# Patient Record
Sex: Male | Born: 1965 | Race: Asian | Hispanic: No | Marital: Married | Smoking: Never smoker
Health system: Southern US, Community
[De-identification: ages and names within clinical notes are randomized; demographics above are authoritative.]

---

## 1998-10-19 ENCOUNTER — Emergency Department (HOSPITAL_COMMUNITY): Admission: EM | Admit: 1998-10-19 | Discharge: 1998-10-19 | Payer: Self-pay | Admitting: Emergency Medicine

## 1998-12-22 ENCOUNTER — Encounter: Admission: RE | Admit: 1998-12-22 | Discharge: 1998-12-22 | Payer: Self-pay | Admitting: Family Medicine

## 1999-02-02 ENCOUNTER — Encounter: Admission: RE | Admit: 1999-02-02 | Discharge: 1999-02-02 | Payer: Self-pay | Admitting: Family Medicine

## 2014-02-03 ENCOUNTER — Emergency Department (HOSPITAL_COMMUNITY)
Admission: EM | Admit: 2014-02-03 | Discharge: 2014-02-03 | Disposition: A | Discharge status: Nursing Home (Includes ICF) | Payer: PRIVATE HEALTH INSURANCE | Source: Home / Self Care | Attending: Family Medicine | Admitting: Family Medicine

## 2014-02-03 ENCOUNTER — Emergency Department (HOSPITAL_COMMUNITY): Payer: PRIVATE HEALTH INSURANCE

## 2014-02-03 ENCOUNTER — Emergency Department (HOSPITAL_COMMUNITY)
Admission: EM | Admit: 2014-02-03 | Discharge: 2014-02-04 | Disposition: A | Discharge status: Home / Self Care | Payer: PRIVATE HEALTH INSURANCE | Attending: Emergency Medicine | Admitting: Emergency Medicine

## 2014-02-03 ENCOUNTER — Encounter (HOSPITAL_COMMUNITY): Payer: Self-pay | Admitting: Emergency Medicine

## 2014-02-03 DIAGNOSIS — R0602 Shortness of breath: Secondary | ICD-10-CM | POA: Insufficient documentation

## 2014-02-03 DIAGNOSIS — R079 Chest pain, unspecified: Secondary | ICD-10-CM

## 2014-02-03 DIAGNOSIS — R1013 Epigastric pain: Secondary | ICD-10-CM | POA: Insufficient documentation

## 2014-02-03 DIAGNOSIS — R110 Nausea: Secondary | ICD-10-CM | POA: Insufficient documentation

## 2014-02-03 DIAGNOSIS — R0789 Other chest pain: Secondary | ICD-10-CM | POA: Insufficient documentation

## 2014-02-03 LAB — I-STAT TROPONIN, ED
Troponin i, poc: 0 ng/mL (ref 0.00–0.08)
Troponin i, poc: 0 ng/mL (ref 0.00–0.08)

## 2014-02-03 LAB — BASIC METABOLIC PANEL
BUN: 16 mg/dL (ref 6–23)
CHLORIDE: 99 meq/L (ref 96–112)
CO2: 25 meq/L (ref 19–32)
CREATININE: 0.64 mg/dL (ref 0.50–1.35)
Calcium: 8.6 mg/dL (ref 8.4–10.5)
GFR calc Af Amer: >90 mL/min (ref >90)
GFR calc non Af Amer: >90 mL/min (ref >90)
Glucose, Bld: 189 mg/dL — ABNORMAL HIGH (ref 70–99)
Potassium: 3.7 mEq/L (ref 3.7–5.3)
Sodium: 138 mEq/L (ref 137–147)

## 2014-02-03 LAB — CBC
HEMATOCRIT: 43.8 % (ref 39.0–52.0)
Hemoglobin: 15.3 g/dL (ref 13.0–17.0)
MCH: 32.7 pg (ref 26.0–34.0)
MCHC: 34.9 g/dL (ref 30.0–36.0)
MCV: 93.6 fL (ref 78.0–100.0)
Platelets: 208 10*3/uL (ref 150–400)
RBC: 4.68 MIL/uL (ref 4.22–5.81)
RDW: 12.1 % (ref 11.5–15.5)
WBC: 8 10*3/uL (ref 4.0–10.5)

## 2014-02-03 LAB — PRO B NATRIURETIC PEPTIDE

## 2014-02-03 LAB — D-DIMER, QUANTITATIVE: D-Dimer, Quant: <0.27 ug/mL-FEU (ref 0.00–0.48)

## 2014-02-03 MED ORDER — GI COCKTAIL ~~LOC~~
30.0000 mL | Freq: Once | ORAL | Status: AC
  Administered 2014-02-03: 30 mL via ORAL
  Filled 2014-02-03: qty 30

## 2014-02-03 MED ORDER — SODIUM CHLORIDE 0.9 % IV SOLN
Freq: Once | INTRAVENOUS | Status: AC
  Administered 2014-02-03: 19:00:00 via INTRAVENOUS

## 2014-02-03 MED ORDER — OMEPRAZOLE 20 MG PO CPDR: 20.0000 mg | DELAYED_RELEASE_CAPSULE | Freq: Every day | ORAL | Status: DC

## 2014-02-03 MED ORDER — NITROGLYCERIN 0.4 MG SL SUBL
SUBLINGUAL_TABLET | SUBLINGUAL | Status: AC
  Filled 2014-02-03: qty 1

## 2014-02-03 MED ORDER — ASPIRIN 81 MG PO CHEW
324.0000 mg | CHEWABLE_TABLET | Freq: Once | ORAL | Status: AC
  Administered 2014-02-03: 324 mg via ORAL

## 2014-02-03 MED ORDER — FAMOTIDINE 20 MG PO TABS: 20.0000 mg | ORAL_TABLET | Freq: Two times a day (BID) | ORAL | Status: DC

## 2014-02-03 MED ORDER — ASPIRIN 81 MG PO CHEW
CHEWABLE_TABLET | ORAL | Status: AC
  Filled 2014-02-03: qty 4

## 2014-02-03 NOTE — ED Provider Notes (Signed)
Medical screening examination/treatment/procedure(s) were performed by a resident physician or non-physician practitioner and as the supervising physician I was immediately available for consultation/collaboration.  Preston Weill, MD    Green Quincy S Sajjad Honea, MD 02/03/14 2137 

## 2014-02-03 NOTE — ED Notes (Signed)
Pt placed on 2 lpm by [facility]; 100% spO2, and cardiac monitor; 80 bpm.

## 2014-02-03 NOTE — Discharge Instructions (Signed)
Your chest x-ray, lab work, ecg all normal. i think your pain may be due to acid reflux. See information below. Take pepcid and prilosec daily. Follow up with primary care doctor.   Gastroesophageal Reflux Disease, Adult Gastroesophageal reflux disease (GERD) happens when acid from your stomach flows up into the esophagus. When acid comes in contact with the esophagus, the acid causes soreness (inflammation) in the esophagus. Over time, GERD may create small holes (ulcers) in the lining of the esophagus. CAUSES   Increased body weight. This puts pressure on the stomach, making acid rise from the stomach into the esophagus.  Smoking. This increases acid production in the stomach.  Drinking alcohol. This causes decreased pressure in the lower esophageal sphincter (valve or ring of muscle between the esophagus and stomach), allowing acid from the stomach into the esophagus.  Late evening meals and a full stomach. This increases pressure and acid production in the stomach.  A malformed lower esophageal sphincter. Sometimes, no cause is found. SYMPTOMS   Burning pain in the lower part of the mid-chest behind the breastbone and in the mid-stomach area. This may occur twice a week or more often.  Trouble swallowing.  Sore throat.  Dry cough.  Asthma-like symptoms including chest tightness, shortness of breath, or wheezing. DIAGNOSIS  Your caregiver may be able to diagnose GERD based on your symptoms. In some cases, X-rays and other tests may be done to check for complications or to check the condition of your stomach and esophagus. TREATMENT  Your caregiver may recommend over-the-counter or prescription medicines to help decrease acid production. Ask your caregiver before starting or adding any new medicines.  HOME CARE INSTRUCTIONS   Change the factors that you can control. Ask your caregiver for guidance concerning weight loss, quitting smoking, and alcohol consumption.  Avoid foods and  drinks that make your symptoms worse, such as:  Caffeine or alcoholic drinks.  Chocolate.  Peppermint or mint flavorings.  Garlic and onions.  Spicy foods.  Citrus fruits, such as oranges, lemons, or limes.  Tomato-based foods such as sauce, chili, salsa, and pizza.  Fried and fatty foods.  Avoid lying down for the 3 hours prior to your bedtime or prior to taking a nap.  Eat small, frequent meals instead of large meals.  Wear loose-fitting clothing. Do not wear anything tight around your waist that causes pressure on your stomach.  Raise the head of your bed 6 to 8 inches with wood blocks to help you sleep. Extra pillows will not help.  Only take over-the-counter or prescription medicines for pain, discomfort, or fever as directed by your caregiver.  Do not take aspirin, ibuprofen, or other nonsteroidal anti-inflammatory drugs (NSAIDs). SEEK IMMEDIATE MEDICAL CARE IF:   You have pain in your arms, neck, jaw, teeth, or back.  Your pain increases or changes in intensity or duration.  You develop nausea, vomiting, or sweating (diaphoresis).  You develop shortness of breath, or you faint.  Your vomit is green, yellow, black, or looks like coffee grounds or blood.  Your stool is red, bloody, or black. These symptoms could be signs of other problems, such as heart disease, gastric bleeding, or esophageal bleeding. MAKE SURE YOU:   Understand these instructions.  Will watch your condition.  Will get help right away if you are not doing well or get worse. Document Released: 07/04/2005 Document Revised: 12/17/2011 Document Reviewed: 04/13/2011 [facility] Patient Information 2014 [place], [place].   Emergency Department Resource Guide 1) Find a Doctor and  Pay Out of Pocket Although you won't have to find out who is covered by your insurance plan, it is a good idea to ask around and get recommendations. You will then need to call the office and see if the doctor you have  chosen will accept you as a new patient and what types of options they offer for patients who are self-pay. Some doctors offer discounts or will set up payment plans for their patients who do not have insurance, but you will need to ask so you aren't surprised when you get to your appointment.  2) Contact Your Local Health Department Not all health departments have doctors that can see patients for sick visits, but many do, so it is worth a call to see if yours does. If you don't know where your local health department is, you can check in your phone book. The CDC also has a tool to help you locate your state's health department, and many state websites also have listings of all of their local health departments.  3) Find a Walk-in Clinic If your illness is not likely to be very severe or complicated, you may want to try a walk in clinic. These are popping up all over the country in pharmacies, drugstores, and shopping centers. They're usually staffed by nurse practitioners or physician assistants that have been trained to treat common illnesses and complaints. They're usually fairly quick and inexpensive. However, if you have serious medical issues or chronic medical problems, these are probably not your best option.  No Primary Care Doctor: - Call Health Connect at  [phone] - they can help you locate a primary care doctor that  accepts your insurance, provides certain services, etc. - Physician Referral Service- [phone]  Chronic Pain Problems: Organization         Address  Phone   Notes  [facility]  [phone] Patients need to be referred by their primary care doctor.   Medication Assistance: Organization         Address  Phone   Notes  [facility] Medication [facility] [address] [place], [place] [address] [phone] --Must be a resident of [facility] -- Must have NO insurance coverage whatsoever (no Medicaid/ Medicare,  etc.) -- The pt. MUST have a primary care doctor that directs their care regularly and follows them in the community   [facility]  [phone]   [employer]  [phone]    Agencies that provide inexpensive medical care: Organization         Address  Phone   Notes  [facility]  [phone]   [facility]    [phone]   [facility] [address] [place], [place] [address] [phone]   [facility] [address] [place] [place] [phone]   Planned Parenthood    [phone]   [facility]    [phone]   [facility]  [address], [place] Phone:  [phone], Fax:  [phone] Hours of Operation:  9 am - 6 pm, M-F.  Also accepts Medicaid/Medicare and self-pay.  [facility]  [address], [place] Phone: [phone], Fax: [phone]. Hours of Operation:  8:30 am - 5:30 pm, M-F.  Also accepts Medicaid and self-pay.  [facility] [place] [address], [employer] Phone: [phone]  [facility] [address], [place], [place] [phone], Ext. [phone] Mondays & Thursdays: 7-9 AM.  First 15 patients are seen on a first come, first serve basis.    Medicaid-accepting [facility] Providers:  Organization         Address  Phone   Notes  [facility] [address], [place] [phone] Also accepts self-pay patients.  [facility] [address], [place]  [phone]) [phone]   [facility] [address], [place] [phone]) [phone]   [facility] [address] [place] [address], [place] [phone]) [phone]   Renaye RakersVeita Bland [address], [place]   [phone]) [phone] Only accepts [place] Access [place]icaid patients after they have their name applied to their card.   Self-Pay (no  insurance) in [facility]:  Organization         Address  Phone   Notes  Sickle Cell Patients, [facility] [address], [place] [phone]   [facility] [address], [place] [phone]) [phone]   [facility]  [address], [place] [phone]   [facility]/Dr. Osei-Bonsu  [address] [place] [address], [place] or [phone], [place] [phone]) [phone] Phone number for both [place] and [place] locations is the same.  [facility] [address], [place] [phone]) [phone]   [facility] [address] [place] [address], [place] or [address] [phone]) [phone]   [facility] [address], [place] [phone]) [phone], phone; [phone](336) [phone], fax Sees patients 1st and 3rd Saturday of every month.  Must not qualify for public or private insurance (i.e. Medicaid, Medicare, [place] Health Choice, Veterans' Benefits)  Household income should be no more than 200% of the poverty level The clinic cannot treat you if you are pregnant or think you are pregnant  Sexually transmitted diseases are not treated at the clinic.    Dental Care: Organization         Address  Phone  Notes  [facility] [address], [place] [phone]) [phone] Accepts children up to age 46 who are enrolled in [place]icaid or [place] Health Choice; pregnant women with a Medicaid card; and children who have applied for Medicaid or [place] Health Choice, but were declined, whose parents can pay a reduced fee at time of service.  [facility] of [facility] [place]  [address], [place] [phone]) [phone] Accepts children up to age 46 who are enrolled in [place]icaid or [place] Health Choice; pregnant women with a Medicaid card; and children who have applied for Medicaid or [place] Health Choice, but were  declined, whose parents can pay a reduced fee at time of service.  [facility]  [address], [place] [phone]) [phone] Patients are seen by appointment only. Walk-ins are not accepted. [facility] will see patients 46 years of age and older. Monday - Tuesday (8am-5pm) Most Wednesdays (8:30-5pm) $30 per visit, cash only  [facility]  [address], [facility] [phone]) [phone] Patients are seen by appointment only. Walk-ins are not accepted. [facility] will see patients 46 years of age and older. One Wednesday Evening (Monthly: Volunteer Based).  $30 per visit,  cash only  [employer] [facility]  [phone] for adults; Children under age 604, call Graduate Pediatric Dentistry at [phone]. Children aged 874-14, please call [phone] to request a pediatric application.  Dental services are provided in all areas of dental care including fillings, crowns and bridges, complete and partial dentures, implants, gum treatment, root canals, and extractions. Preventive care is also provided. Treatment is provided to both adults and children. Patients are selected via a lottery and there is often a waiting list.   [facility] [address], [place]  [phone]) [phone] [email or web address]   [facility] [address], [place], [place] [phone], Ext. [phone] Second and Fourth Thursday of each month, opens at 6:30 AM; Clinic ends at 9 AM.  Patients are seen on a first-come first-served basis, and a limited number are seen during each clinic.   [facility]  [address], [place], [place] [phone]) [phone]   Eligibility Requirements You must have lived in [place], [place], or [place] counties for at least the last three months.   You cannot be eligible for state or federal sponsored National Cityhealthcare insurance, including CIGNAVeterans Administration, [place]icaid, or [employer]icare.   You generally cannot be  eligible for healthcare insurance through your employer.    How to apply: Eligibility screenings are held every Tuesday and Wednesday afternoon from 1:00 pm until 4:00 pm. You do not need an appointment for the interview!  [facility] [address], [place], [place] [phone]   [facility]  [phone]   [facility]  [phone]   [facility]  [phone]    Behavioral Health Resources in the Community: Intensive Outpatient Programs Organization         Address  Phone  Notes  [facility] [address], [place], [place] [phone]   [facility] [address], [place], [place] [phone]   [facility] [address], [place], [place]  [phone]   [facility] [address],  [place], [place] [phone] or [phone]   Substance Abuse Resources Organization         Address  Phone  Notes  [facility]  [phone]   [facility]  [phone]   [facility]  [phone]   [facility]  [phone]   Residential & Outpatient Substance Abuse Program  [phone]   Psychological Services Organization         Address  Phone  Notes  [facility]  [phone]- [phone]   [facility]  [phone]- [phone]   [facility] [address], [place] [phone] or [phone]    Mobile Crisis Teams Organization         Address  Phone  Notes  [facility], Mobile Crisis Care Unit  [phone]   [facility]  [address] [place], [place] [phone]   Doristine LocksSharon DeEsch [address] [place] [phone]    Self-Help/Support Groups Organization         Address  Phone             Notes  [facility] [place] - variety of support groups  336- [phone] Call for more information    Narcotics Anonymous (NA), [facility] [address], [place]  2 meetings at this location   [facility]istician[facility]idential Treatment Programs Organization  Address  Phone  Notes  °[facility] [address],    °[place]  [phone]   °[facility] ° [address], [place], [place] [phone]   °[facility] [address], [place] [phone] Admissions: 8am-3pm M-F  °[facility] [address]    °[place], [place] [phone]   °[facility] [address], [place], [place] [phone]   °[facility] [address]  °[place], [place] [phone]   °[facility] - Intensive Outpatient [address], [place], [place] [phone]   °[facility] ([facility] [address]  °[place], [place] [phone] or [phone]   °[facility] ([facility]) [address] [place], [place] [phone] Accepts Medicaid  °[facility] [address]  °[place] 1-800-659-3381 Substance Abuse/Addiction Treatment  ° °[place] Behavioral Health Resources °Organization         Address  Phone  Notes  °[facility]  (888) [phone]   °Julie Brannon, PhD [address] [place], [place]   (336) [phone] or (336) 951-0000   °[facility]   [address] °[place], [place] (336) [phone]   °[facility] [address], [place], [place] (336) [phone] Insurance/Medicaid/sponsorship through [facility]  °[facility] [address]                                    [place], [place] ([phone]) [phone] Therapy/tele-psych/case  °[facility] [address]  ° [place], [place] ([phone]) [phone]    °Dr. Arfeen  ([phone]) [phone]   °[facility] [place]  [employer] [place] [facility] 1) [address], [place] °2) [address], [place] °3)  [address], [place] (336) [phone] °(336)  [phone] ° °(336) [phone]   °[place] Child Abuse Hotline (336) 342-1394 or (336) 342-3537 (After Hours)    ° ° ° °

## 2014-02-03 NOTE — ED Notes (Addendum)
C/o SOB and chest pain onset 2-3 days ago.  Points to epigastric area( pointed to L ant chest when EMT assessed pt.).  Pain comes and goes and lasts 30 min.  Had 1 episode today.  C/o nausea.  Radiation of pain to his neck.  No sweating.  C/o H/A onset today. Pain onset while lying in bed trying to sleep.

## 2014-02-03 NOTE — ED Notes (Signed)
PER EMS: pt transferred from Urgent Care, was seen there for epigastric, mid lower chest pain that started 3 days ago associated with SOB and nausea. [place] from [place], his father had just recently passed away. Pt reports he is pain free at this time.  BP-133/85, HR-89, RR-16, 100% 2LNC.

## 2014-02-03 NOTE — ED Provider Notes (Signed)
CSN: [id number]     Arrival date & time 02/03/14  1905 History   First MD Initiated Contact with Patient 02/03/14 1906     Chief Complaint  Patient presents with  . Chest Pain     (Consider location/radiation/quality/duration/timing/severity/associated sxs/prior Treatment) HPI Patrick Gutierrez is a 48 y.o. male who presents emergency department complaining of chest pain. Patient states that he has had lower chest/epigastric pain for last 3 days that comes and goes. States pain is mainly when he comes home from work and when he lays down. He denies any associated symptoms. He states that pain is a burning, radiates to his throat at times. He reports history of heartburn. He states he does sometimes develop pain after eating in the same area. He denies any heart problems. No family history of heart problems except for a heart failure in his father. Patient is not a smoker. He is diabetic, well-controlled. Patient went to urgent care because he is having pain, transferred here for further evaluation.  History reviewed. No pertinent past medical history. History reviewed. No pertinent past surgical history. Family History  Problem Relation Age of Onset  . Heart failure Father    History  Substance Use Topics  . Smoking status: Never Smoker   . Smokeless tobacco: Not on file  . Alcohol Use: Yes     Comment: occasional    Review of Systems  Constitutional: Negative for fever and chills.  Respiratory: Positive for shortness of breath. Negative for cough and chest tightness.   Cardiovascular: Positive for chest pain. Negative for palpitations and leg swelling.  Gastrointestinal: Positive for nausea and abdominal pain. Negative for vomiting, diarrhea and abdominal distention.  Genitourinary: Negative for dysuria, urgency, frequency and hematuria.  Musculoskeletal: Negative for arthralgias, myalgias, neck pain and neck stiffness.  Skin: Negative for rash.  Allergic/Immunologic: Negative for  immunocompromised state.  Neurological: Negative for dizziness, weakness, light-headedness, numbness and headaches.      Allergies  Review of patient's allergies indicates no known allergies.  Home Medications   Prior to Admission medications   Not on File   BP 111/74  Pulse 65  Temp(Src) 98.4 F (36.9 C) (Oral)  Resp 15  SpO2 99% Physical Exam  Nursing note and vitals reviewed. Constitutional: He appears well-developed and well-nourished. No distress.  HENT:  Head: Normocephalic and atraumatic.  Eyes: Conjunctivae are normal.  Neck: Neck supple.  Cardiovascular: Normal rate, regular rhythm and normal heart sounds.   Pulmonary/Chest: Effort normal. No respiratory distress. He has no wheezes. He has no rales.  Abdominal: Soft. Bowel sounds are normal. He exhibits no distension. There is tenderness. There is no rebound.  Epigastric tenderness  Musculoskeletal: He exhibits no edema.  Neurological: He is alert.  Skin: Skin is warm and dry.    ED Course  Procedures (including critical care time) Labs Review Labs Reviewed  BASIC METABOLIC PANEL - Abnormal; Notable for the following:    Glucose, Bld 189 (*)    All other components within normal limits  CBC  D-DIMER, QUANTITATIVE  PRO B NATRIURETIC PEPTIDE  I-STAT TROPOININ, ED    Imaging Review Dg Chest 2 View  02/03/2014   CLINICAL DATA:  Chest pain.  EXAM: CHEST  2 VIEW  COMPARISON:  None.  FINDINGS: The lungs are mildly hyperinflated. There is no focal infiltrate. There is minimal prominence of the perihilar interstitial markings. The cardiopericardial silhouette is normal in size. The mediastinum is normal in width. The pulmonary vascularity is not engorged. There  is no pleural effusion. The trachea is midline. The observed portions of the bony thorax exhibit no acute abnormalities.  IMPRESSION: There is mild hyperinflation which may reflect COPD or reactive airway disease. One cannot exclude acute bronchitis in the  appropriate clinical setting. There is no evidence of pneumonia nor CHF.   Electronically Signed   By: David  Swazil[place]Jordan   On: 02/03/2014 20:21     EKG Interpretation   Date/Time:  Wednesday February 03 2014 19:11:44 EDT Ventricular Rate:  79 PR Interval:  143 QRS Duration: 79 QT Interval:  362 QTC Calculation: 415 R Axis:   49 Text Interpretation:  Sinus rhythm No significant change since last  tracing Confirmed by [facility]  MD, MARTHA [phone]) on 02/03/2014 9:51:00 PM      MDM   Final diagnoses:  Chest pain  Epigastric pain    Pt here with epigastric abdominal/lower chest pain. Reports burning in his chest and his throat. States pain is worsened with eating as well. History of heartburn. Pain sounds very atypical, and tenderness reproducible with palpation of epigastric area. Labs, chest x-ray, troponin ordered. I given him GI cocktail which took his pain completely away.   Patient monitored for 5 hours, delta troponin was repeated both negative. Patient remains pain-free. He is heart score 1, TIMI 0. Highly unlikely that his pain is due to CAD. D dimer obtained to r/o pe. His cxr is negative except for bronchitic changes. Pt denise any smoking hx, ho hx of asthma, no current cough. Discussed results with pt and his family. Will start on pepcid, prilosec, suspect most likely GERD. Follow up with pcp.    Filed Vitals:   02/03/14 2100 02/03/14 2215 02/03/14 2300 02/04/14 0003  BP: 124/82 119/71 111/74 116/79  Pulse: 98 92 65 71  Temp:    98 F (36.7 C)  TempSrc:    Oral  Resp: 24 19 15 18   SpO2: 100% 99% 99% 100%       Lottie Musselatyana A Detrich Rakestraw, PA-C 02/04/14 0220

## 2014-02-03 NOTE — ED Notes (Signed)
[facility] here. Report given to RN. 

## 2014-02-03 NOTE — ED Provider Notes (Signed)
CSN: [id number]     Arrival date & time 02/03/14  1759 History   First MD Initiated Contact with Patient 02/03/14 1821     Chief Complaint  Patient presents with  . Shortness of Breath   (Consider location/radiation/quality/duration/timing/severity/associated sxs/prior Treatment) HPI  48 year old M with chest pain. Intermittent for several days. This episode started about 30 minutes ago. It is located in his central chest and does not radiate. It is associated with shortness of breath and nausea but no vomiting. It is not associated with exertion. He denies any recent trauma. He does have diabetes, smoking tobacco, hypertension, or hyperlipidemia. He denies any family history of cardiac disease. The past medical history of cardiac or pulmonary disease.  The patient speaks the knees and no professional interpreter was available. Given the urgency of the evaluation, his daughter was used for interpretation.  History reviewed. No pertinent past medical history. History reviewed. No pertinent past surgical history. Family History  Problem Relation Age of Onset  . Heart failure Father    History  Substance Use Topics  . Smoking status: Never Smoker   . Smokeless tobacco: Not on file  . Alcohol Use: Yes     Comment: occasional    Review of Systems See HPI Allergies  Review of patient's allergies indicates no known allergies.  Home Medications   Prior to Admission medications   Not on File   BP 135/94  Pulse 98  Temp(Src) 97.9 F (36.6 C) (Oral)  Resp 18  SpO2 100% Physical Exam Gen: middle-aged male is male, pleasant, uncomfortable appearing, Normal body habitus  CV: regular rhythm, no murmurs Pulmonary: clear to auscultation bilaterally, normal work of breathing  Abdomen: soft, nondistended, nontender  ED Course  Procedures (including critical care time) Labs Review Labs Reviewed - No data to display  Imaging Review No results found.   Date: 02/03/2014  Rate:  82  Rhythm: normal sinus rhythm  QRS Axis: normal  Intervals: normal  ST/T Wave abnormalities: normal  Conduction Disutrbances:none  Narrative Interpretation: no STEMI  Old EKG Reviewed: none available     MDM   1. Chest pain    Given ASA 324 mg and nitroglycerin. No evidence of STEMI, but concerning for cardiac origin, so [facility]     Garnetta BuddyEdward V Ulric Salzman, MD 02/03/14 [phone]

## 2014-02-04 NOTE — ED Provider Notes (Signed)
Medical screening examination/treatment/procedure(s) were performed by non-physician practitioner and as supervising physician I was immediately available for consultation/collaboration.    Raedyn Klinck D Alexias Margerum, MD 02/04/14 0705 

## 2014-03-26 ENCOUNTER — Ambulatory Visit (INDEPENDENT_AMBULATORY_CARE_PROVIDER_SITE_OTHER): Payer: No Typology Code available for payment source | Admitting: Internal Medicine

## 2014-03-26 VITALS — BP 106/70 | HR 69 | Temp 98.1°F | Resp 16 | Ht 63.25 in | Wt 105.4 lb

## 2014-03-26 DIAGNOSIS — R7309 Other abnormal glucose: Secondary | ICD-10-CM

## 2014-03-26 DIAGNOSIS — R739 Hyperglycemia, unspecified: Secondary | ICD-10-CM

## 2014-03-26 DIAGNOSIS — Z0000 Encounter for general adult medical examination without abnormal findings: Secondary | ICD-10-CM

## 2014-03-26 LAB — POCT CBC
GRANULOCYTE PERCENT: 61.8 % (ref 37–80)
HCT, POC: 44.4 % (ref 43.5–53.7)
Hemoglobin: 14.6 g/dL (ref 14.1–18.1)
Lymph, poc: 2 (ref 0.6–3.4)
MCH, POC: 32.3 pg — AB (ref 27–31.2)
MCHC: 32.9 g/dL (ref 31.8–35.4)
MCV: 98.2 fL — AB (ref 80–97)
MID (cbc): 0.5 (ref 0–0.9)
MPV: 9.7 fL (ref 0–99.8)
PLATELET COUNT, POC: 209 10*3/uL (ref 142–424)
POC GRANULOCYTE: 4 (ref 2–6.9)
POC LYMPH %: 30.6 % (ref 10–50)
POC MID %: 7.6 %M (ref 0–12)
RBC: 4.52 M/uL — AB (ref 4.69–6.13)
RDW, POC: 12.6 %
WBC: 6.5 10*3/uL (ref 4.6–10.2)

## 2014-03-26 LAB — POCT GLYCOSYLATED HEMOGLOBIN (HGB A1C): Hemoglobin A1C: 5

## 2014-03-26 LAB — GLUCOSE, POCT (MANUAL RESULT ENTRY): POC GLUCOSE: 94 mg/dL (ref 70–99)

## 2014-03-26 NOTE — Progress Notes (Signed)
Subjective:    Patient ID: Patrick Gutierrez, male    DOB: 06/10/1966, 48 y.o.   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  HENT: Negative for congestion, dental problem, hearing loss, rhinorrhea, trouble swallowing and voice change.   Eyes: Negative for photophobia and visual disturbance.  Respiratory: Negative for apnea, cough, shortness of breath and wheezing.   Cardiovascular: Negative for chest pain, palpitations and leg swelling.  Gastrointestinal: Negative for nausea, vomiting, abdominal pain, diarrhea and constipation.  Genitourinary: Negative for difficulty urinating and testicular pain.       No Nocturia   Musculoskeletal: Negative for arthralgias, back pain, gait problem, joint swelling, myalgias and neck pain.  Skin: Negative for rash.  Allergic/Immunologic: Negative for immunocompromised state.  Neurological: Negative for dizziness, speech difficulty, light-headedness and headaches.  Hematological: Negative for adenopathy. Does not bruise/bleed easily.  Psychiatric/Behavioral: Negative for behavioral problems, sleep disturbance and dysphoric mood.       Objective:   Physical Exam  Constitutional: He is oriented to person, place, and time. He appears well-developed and well-nourished.  Thin but appears healthy  HENT:  Head: Normocephalic.  Right Ear: External ear normal.  Left Ear: External ear normal.  Nose: Nose normal.  Mouth/Throat: Oropharynx is clear and moist.  Tms and canals clear    Eyes: Conjunctivae and EOM are normal. Pupils are equal, round, and reactive to light.  Neck: Normal range of motion. Neck supple. No thyromegaly present.  Cardiovascular: Normal rate, regular rhythm, normal heart sounds and intact distal pulses.   No murmur heard. Pulmonary/Chest: Effort normal and breath sounds normal. No respiratory distress. He has no wheezes. He has no rales.  Abdominal: Soft. Bowel sounds are normal. He exhibits no distension and no mass. There is no tenderness. There is no rebound and no guarding.  No hepatosplenomegaly  Musculoskeletal: Normal range of motion. He exhibits no edema and no tenderness.  Lymphadenopathy:    He has no cervical adenopathy.  Neurological: He is alert and oriented to person, place, and time. He has normal reflexes. No cranial nerve deficit. He exhibits normal muscle tone. Coordination normal.  Skin: Skin is warm and dry. No rash noted.  Psychiatric: He has a normal mood and affect. His behavior is normal. Judgment and thought content normal.   BP 106/70  Pulse 69  Temp(Src) 98.1 F (36.7 C) (Oral)  Resp 16  Ht 5' 3.25" (1.607 m)  Wt 105 lb 6.4 oz (47.809 kg)  BMI 18.51 kg/m2  SpO2 99%  Results for orders placed in visit on 03/26/14  POCT CBC      Result Value Ref Range   WBC 6.5  4.6 - 10.2 K/uL   Lymph, poc 2.0  0.6 - 3.4   POC LYMPH PERCENT 30.6  10 - 50 %L   MID (cbc) 0.5  0 - 0.9   POC MID % 7.6  0 - 12 %M   POC Granulocyte 4.0  2 - 6.9   Granulocyte percent 61.8  37 - 80 %G   RBC 4.52 (*) 4.69 - 6.13 M/uL   Hemoglobin 14.6  14.1 -  18.1 g/dL   HCT, POC 16.144.4  09.643.5 - 53.7 %   MCV 98.2 (*) 80 - 97 fL   MCH, POC 32.3 (*) 27 - 31.2 pg   MCHC 32.9  31.8 - 35.4 g/dL   RDW, POC 04.512.6     Platelet Count, POC 209  142 - 424 K/uL   MPV 9.7  0 - 99.8 fL  POCT GLYCOSYLATED HEMOGLOBIN (HGB A1C)      Result Value Ref Range   Hemoglobin A1C 5.0    GLUCOSE, POCT (MANUAL RESULT ENTRY)      Result Value Ref Range   POC Glucose 94   70 - 99 mg/dl  Note hyperglycemia in recent emergency room visit       Assessment & Plan:  Annual physical exam - Plan: Lipid panel  Hyperglycemia -reassured  Notify labs

## 2014-03-27 LAB — LIPID PANEL
CHOLESTEROL: 175 mg/dL (ref 0–200)
HDL: 58 mg/dL (ref >39)
LDL CALC: 97 mg/dL (ref 0–99)
TRIGLYCERIDES: 101 mg/dL (ref <150)
Total CHOL/HDL Ratio: 3 Ratio
VLDL: 20 mg/dL (ref 0–40)

## 2014-03-29 ENCOUNTER — Encounter: Payer: Self-pay | Admitting: Internal Medicine

## 2015-02-19 ENCOUNTER — Ambulatory Visit (INDEPENDENT_AMBULATORY_CARE_PROVIDER_SITE_OTHER): Payer: No Typology Code available for payment source | Admitting: Family Medicine

## 2015-02-19 VITALS — BP 118/88 | HR 74 | Temp 98.3°F | Resp 16 | Ht 64.0 in | Wt 105.8 lb

## 2015-02-19 DIAGNOSIS — G44209 Tension-type headache, unspecified, not intractable: Secondary | ICD-10-CM | POA: Diagnosis not present

## 2015-02-19 DIAGNOSIS — S161XXA Strain of muscle, fascia and tendon at neck level, initial encounter: Secondary | ICD-10-CM

## 2015-02-19 DIAGNOSIS — Z8719 Personal history of other diseases of the digestive system: Secondary | ICD-10-CM

## 2015-02-19 MED ORDER — CYCLOBENZAPRINE HCL 10 MG PO TABS: 5.0000 mg | ORAL_TABLET | Freq: Every day | ORAL | Status: DC

## 2015-02-19 MED ORDER — NAPROXEN 500 MG PO TABS: 500.0000 mg | ORAL_TABLET | Freq: Two times a day (BID) | ORAL | Status: DC

## 2015-02-19 MED ORDER — RANITIDINE HCL 150 MG PO TABS: 150.0000 mg | ORAL_TABLET | Freq: Two times a day (BID) | ORAL | Status: DC

## 2015-02-19 NOTE — Progress Notes (Signed)
02/19/2015 at 2:35 PM  Patrick Gutierrez / DOB: 08/30/1966 / MRN: [id number]  The patient has Chest pain-ER visit within normal limits on his problem list.  SUBJECTIVE  Chief complaint: Neck Pain; Back Pain; and Medication Refill   Patient here for neck pain that started about one week ago.  He denies trauma and a history of neck pain. He reports having pain with moving his head, and also has a headache.  He denies photophobia, nausea and pulsatility of the HA. He denies parasthesia, changes in appetite and difficulty swallowing.   He  has no past medical history on file.    Medications reviewed and updated by myself where necessary, and exist elsewhere in the encounter.   [name] has No Known Allergies. He  reports that he has never smoked. He does not have any smokeless tobacco history on file. He reports that he drinks alcohol. He reports that he does not use illicit drugs. He  has no sexual activity history on file. The patient  has no past surgical history on file.  His family history includes Heart failure in his father.  Review of Systems  Constitutional: Negative for fever and chills.  Eyes: Negative for blurred vision.  Cardiovascular: Negative for chest pain.  Gastrointestinal: Negative for nausea.  Musculoskeletal: Positive for myalgias and neck pain. Negative for back pain, joint pain and falls.  Neurological: Negative for headaches.    OBJECTIVE  His  height is 5\' 4"  (1.626 m) and weight is 105 lb 12.8 oz (47.991 kg). His oral temperature is 98.3 F (36.8 C). His blood pressure is 118/88 and his pulse is 74. His respiration is 16.  The patient's body mass index is 18.15 kg/(m^2).  Physical Exam  Cardiovascular: Normal rate.   Respiratory: Effort normal.  GI: Soft.  Neurological: He is alert. He has normal reflexes. He displays no atrophy, no tremor and normal reflexes. No cranial nerve deficit or sensory deficit. He exhibits normal muscle tone. He displays no seizure  activity. Coordination and gait normal.  Reflex Scores:      Tricep reflexes are 2+ on the right side and 2+ on the left side.      Bicep reflexes are 2+ on the right side and 2+ on the left side.      Brachioradialis reflexes are 2+ on the right side and 2+ on the left side.      Patellar reflexes are 2+ on the right side and 2+ on the left side.      Achilles reflexes are 2+ on the right side and 2+ on the left side. RAM, heel and toe, heel to shin, finger to nose, vibration and position sense intact.    Skin: Skin is warm and dry.    No results found for this or any previous visit (from the past 24 hour(s)).  ASSESSMENT & PLAN  Patrick Gutierrez was seen today for neck pain, back pain and medication refill.  Diagnoses and all orders for this visit:  Tension-type headache, not intractable, unspecified chronicity pattern Orders: -     naproxen (NAPROSYN) 500 MG tablet; Take 1 tablet (500 mg total) by mouth 2 (two) times daily with a meal.  Neck strain, initial encounter Orders: -     cyclobenzaprine (FLEXERIL) 10 MG tablet; Take 0.5 tablets (5 mg total) by mouth at bedtime. Take this medication when taking naprosyn to protect your stomach.  History of gastroesophageal reflux (GERD) Orders: -     ranitidine (ZANTAC) 150 MG  tablet; Take 1 tablet (150 mg total) by mouth 2 (two) times daily.    The patient was advised to call or come back to clinic if he does not see an improvement in symptoms, or worsens with the above plan.   Deliah BostonMichael Johnothan Bascomb, MHS, PA-C [facility] 02/19/2015 2:35 PM

## 2015-02-19 NOTE — Progress Notes (Signed)
Xray read and patient discussed with [name]. Agree with assessment and plan of care per his note. Noted zantac to be used if taking naprosyn for some gastroprotection with hx of GERD.

## 2015-03-26 ENCOUNTER — Ambulatory Visit (INDEPENDENT_AMBULATORY_CARE_PROVIDER_SITE_OTHER): Payer: No Typology Code available for payment source | Admitting: Family Medicine

## 2015-03-26 VITALS — BP 118/78 | HR 74 | Temp 98.7°F | Resp 16 | Ht 64.0 in | Wt 102.0 lb

## 2015-03-26 DIAGNOSIS — S139XXD Sprain of joints and ligaments of unspecified parts of neck, subsequent encounter: Secondary | ICD-10-CM | POA: Diagnosis not present

## 2015-03-26 DIAGNOSIS — Z131 Encounter for screening for diabetes mellitus: Secondary | ICD-10-CM

## 2015-03-26 DIAGNOSIS — Z0000 Encounter for general adult medical examination without abnormal findings: Secondary | ICD-10-CM

## 2015-03-26 DIAGNOSIS — Z1329 Encounter for screening for other suspected endocrine disorder: Secondary | ICD-10-CM

## 2015-03-26 DIAGNOSIS — Z13220 Encounter for screening for lipoid disorders: Secondary | ICD-10-CM

## 2015-03-26 DIAGNOSIS — Z130 Encounter for screening for diseases of the blood and blood-forming organs and certain disorders involving the immune mechanism: Secondary | ICD-10-CM | POA: Diagnosis not present

## 2015-03-26 LAB — POCT GLYCOSYLATED HEMOGLOBIN (HGB A1C): Hemoglobin A1C: 5.3

## 2015-03-26 MED ORDER — CYCLOBENZAPRINE HCL 10 MG PO TABS: 5.0000 mg | ORAL_TABLET | Freq: Every day | ORAL | Status: DC

## 2015-03-26 MED ORDER — NAPROXEN 500 MG PO TABS: 500.0000 mg | ORAL_TABLET | Freq: Two times a day (BID) | ORAL | Status: DC

## 2015-03-26 NOTE — Progress Notes (Signed)
Chief Complaint:  Chief Complaint  Patient presents with  . Annual Exam    HPI: Patrick Gutierrez is a 49 y.o. male who is here for  An annual exam He has one last year He has no complaints, he states the muscle relaxer and NSAID he got for his neck spasms, sprain and sprains work well and would like a refill Denies n/w/t, denies depression, dysuria, urinary sxs or family hx of prostate cancer  No past medical history on file. No past surgical history on file. History   Social History  . Marital Status: Married    Spouse Name: N/A  . Number of Children: N/A  . Years of Education: N/A   Social History Main Topics  . Smoking status: Never Smoker   . Smokeless tobacco: Not on file  . Alcohol Use: Yes     Comment: occasional  . Drug Use: No  . Sexual Activity: Not on file   Other Topics Concern  . None   Social History Narrative   Family History  Problem Relation Age of Onset  . Heart failure Father    No Known Allergies Prior to Admission medications   Medication Sig Start Date End Date Taking? Authorizing Provider  cyclobenzaprine (FLEXERIL) 10 MG tablet Take 0.5 tablets (5 mg total) by mouth at bedtime. Take this medication when taking naprosyn to protect your stomach. 02/19/15  Yes Ofilia Neas, PA-C  naproxen (NAPROSYN) 500 MG tablet Take 1 tablet (500 mg total) by mouth 2 (two) times daily with a meal. 02/19/15  Yes Ofilia Neas, PA-C  ranitidine (ZANTAC) 150 MG tablet Take 1 tablet (150 mg total) by mouth 2 (two) times daily. 02/19/15  Yes Ofilia Neas, PA-C     ROS: The patient denies fevers, chills, night sweats, unintentional weight loss, chest pain, palpitations, wheezing, dyspnea on exertion, nausea, vomiting, abdominal pain, dysuria, hematuria, melena, numbness, weakness, or tingling.   All other systems have been reviewed and were otherwise negative with the exception of those mentioned in the HPI and as above.    PHYSICAL EXAM: Filed Vitals:     03/26/15 1608  BP: 118/78  Pulse: 74  Temp: 98.7 F (37.1 C)  Resp: 16   Filed Vitals:   03/26/15 1608  Height: 5\' 4"  (1.626 m)  Weight: 102 lb (46.267 kg)   Body mass index is 17.5 kg/(m^2).   General: Alert, no acute distress HEENT:  Normocephalic, atraumatic, oropharynx patent. EOMI, PERRLA, fundo exam normal Cardiovascular:  Regular rate and rhythm, no rubs murmurs or gallops.  No Carotid bruits, radial pulse intact. No pedal edema.  Respiratory: Clear to auscultation bilaterally.  No wheezes, rales, or rhonchi.  No cyanosis, no use of accessory musculature GI: No organomegaly, abdomen is soft and non-tender, positive bowel sounds.  No masses. Skin: No rashes. Neurologic: Facial musculature symmetric. Psychiatric: Patient is appropriate throughout our interaction. Lymphatic: No cervical lymphadenopathy Musculoskeletal: Gait intact. 5/5 strength, 2/2 DTRs Genitals-normal, no rashes, neg for hernia   LABS: Results for orders placed or performed in visit on 03/26/15  CBC  Result Value Ref Range   WBC 6.0 4.0 - 10.5 K/uL   RBC 4.81 4.22 - 5.81 MIL/uL   Hemoglobin 15.7 13.0 - 17.0 g/dL   HCT 08.1 44.8 - 18.5 %   MCV 96.9 78.0 - 100.0 fL   MCH 32.6 26.0 - 34.0 pg   MCHC 33.7 30.0 - 36.0 g/dL   RDW 63.1 49.7 - 02.6 %  Platelets 251 150 - 400 K/uL   MPV 11.2 8.6 - 12.4 fL  COMPLETE METABOLIC PANEL WITH GFR  Result Value Ref Range   Sodium 140 135 - 145 mEq/L   Potassium 4.4 3.5 - 5.3 mEq/L   Chloride 103 96 - 112 mEq/L   CO2 28 19 - 32 mEq/L   Glucose, Bld 93 70 - 99 mg/dL   BUN 13 6 - 23 mg/dL   Creat 1.61 0.96 - 0.45 mg/dL   Total Bilirubin 0.6 0.2 - 1.2 mg/dL   Alkaline Phosphatase 61 39 - 117 U/L   AST 50 (H) 0 - 37 U/L   ALT 35 0 - 53 U/L   Total Protein 7.5 6.0 - 8.3 g/dL   Albumin 4.5 3.5 - 5.2 g/dL   Calcium 8.8 8.4 - 40.9 mg/dL   GFR, Est African American >89 mL/min   GFR, Est Non African American >89 mL/min  TSH  Result Value Ref Range   TSH  0.438 0.350 - 4.500 uIU/mL  Lipid panel  Result Value Ref Range   Cholesterol 178 0 - 200 mg/dL   Triglycerides 811 (H) <150 mg/dL   HDL 45 >=91 mg/dL   Total CHOL/HDL Ratio 4.0 Ratio   VLDL NOT CALC 0 - 40 mg/dL   LDL Cholesterol NOT CALC 0 - 99 mg/dL  POCT glycosylated hemoglobin (Hb A1C)  Result Value Ref Range   Hemoglobin A1C 5.3      EKG/XRAY:   Primary read interpreted by Dr. Conley Rolls at [facility].   ASSESSMENT/PLAN: Encounter Diagnoses  Name Primary?  . Annual physical exam Yes  . Screening for diabetes mellitus   . Screening for thyroid disorder   . Screening for hyperlipidemia   . Screening for deficiency anemia   . Neck sprain and strain, subsequent encounter    Annual labs pending Refill flexeril and naprosyn advise to take with food for NSAID and also monitor for drowsiness Fu in 1 year  Gross sideeffects, risk and benefits, and alternatives of medications d/w patient. Patient is aware that all medications have potential sideeffects and we are unable to predict every sideeffect or drug-drug interaction that may occur.  Almee Pelphrey PHUONG, DO 03/30/2015 9:07 AM

## 2015-03-27 LAB — CBC
HCT: 46.6 % (ref 39.0–52.0)
Hemoglobin: 15.7 g/dL (ref 13.0–17.0)
MCH: 32.6 pg (ref 26.0–34.0)
MCHC: 33.7 g/dL (ref 30.0–36.0)
MCV: 96.9 fL (ref 78.0–100.0)
MPV: 11.2 fL (ref 8.6–12.4)
Platelets: 251 10*3/uL (ref 150–400)
RBC: 4.81 MIL/uL (ref 4.22–5.81)
RDW: 12.9 % (ref 11.5–15.5)
WBC: 6 10*3/uL (ref 4.0–10.5)

## 2015-03-27 LAB — COMPLETE METABOLIC PANEL WITH GFR
ALT: 35 U/L (ref 0–53)
AST: 50 U/L — ABNORMAL HIGH (ref 0–37)
Alkaline Phosphatase: 61 U/L (ref 39–117)
CO2: 28 mEq/L (ref 19–32)
Calcium: 8.8 mg/dL (ref 8.4–10.5)
GFR, Est Non African American: >89 mL/min
Glucose, Bld: 93 mg/dL (ref 70–99)
Sodium: 140 mEq/L (ref 135–145)
Total Protein: 7.5 g/dL (ref 6.0–8.3)

## 2015-03-27 LAB — LIPID PANEL
Cholesterol: 178 mg/dL (ref 0–200)
HDL: 45 mg/dL (ref >=40)
Total CHOL/HDL Ratio: 4 Ratio
Triglycerides: 482 mg/dL — ABNORMAL HIGH (ref <150)

## 2015-03-27 LAB — COMPLETE METABOLIC PANEL WITHOUT GFR
Albumin: 4.5 g/dL (ref 3.5–5.2)
BUN: 13 mg/dL (ref 6–23)
Chloride: 103 meq/L (ref 96–112)
Creat: 0.73 mg/dL (ref 0.50–1.35)
GFR, Est African American: >89 mL/min
Potassium: 4.4 meq/L (ref 3.5–5.3)
Total Bilirubin: 0.6 mg/dL (ref 0.2–1.2)

## 2015-03-27 LAB — TSH: TSH: 0.438 u[IU]/mL (ref 0.350–4.500)

## 2015-05-11 ENCOUNTER — Encounter: Payer: Self-pay | Admitting: Family Medicine

## 2015-05-25 ENCOUNTER — Other Ambulatory Visit: Payer: Self-pay | Admitting: Family Medicine

## 2015-09-06 ENCOUNTER — Other Ambulatory Visit: Payer: Self-pay | Admitting: Physician Assistant

## 2015-10-01 IMAGING — CR DG CHEST 2V
2 series · 2 of 2 positions shown · non-contrast
Comparison: None.

CLINICAL DATA: Chest pain.

EXAM:
CHEST  2 VIEW

[w chest pa]
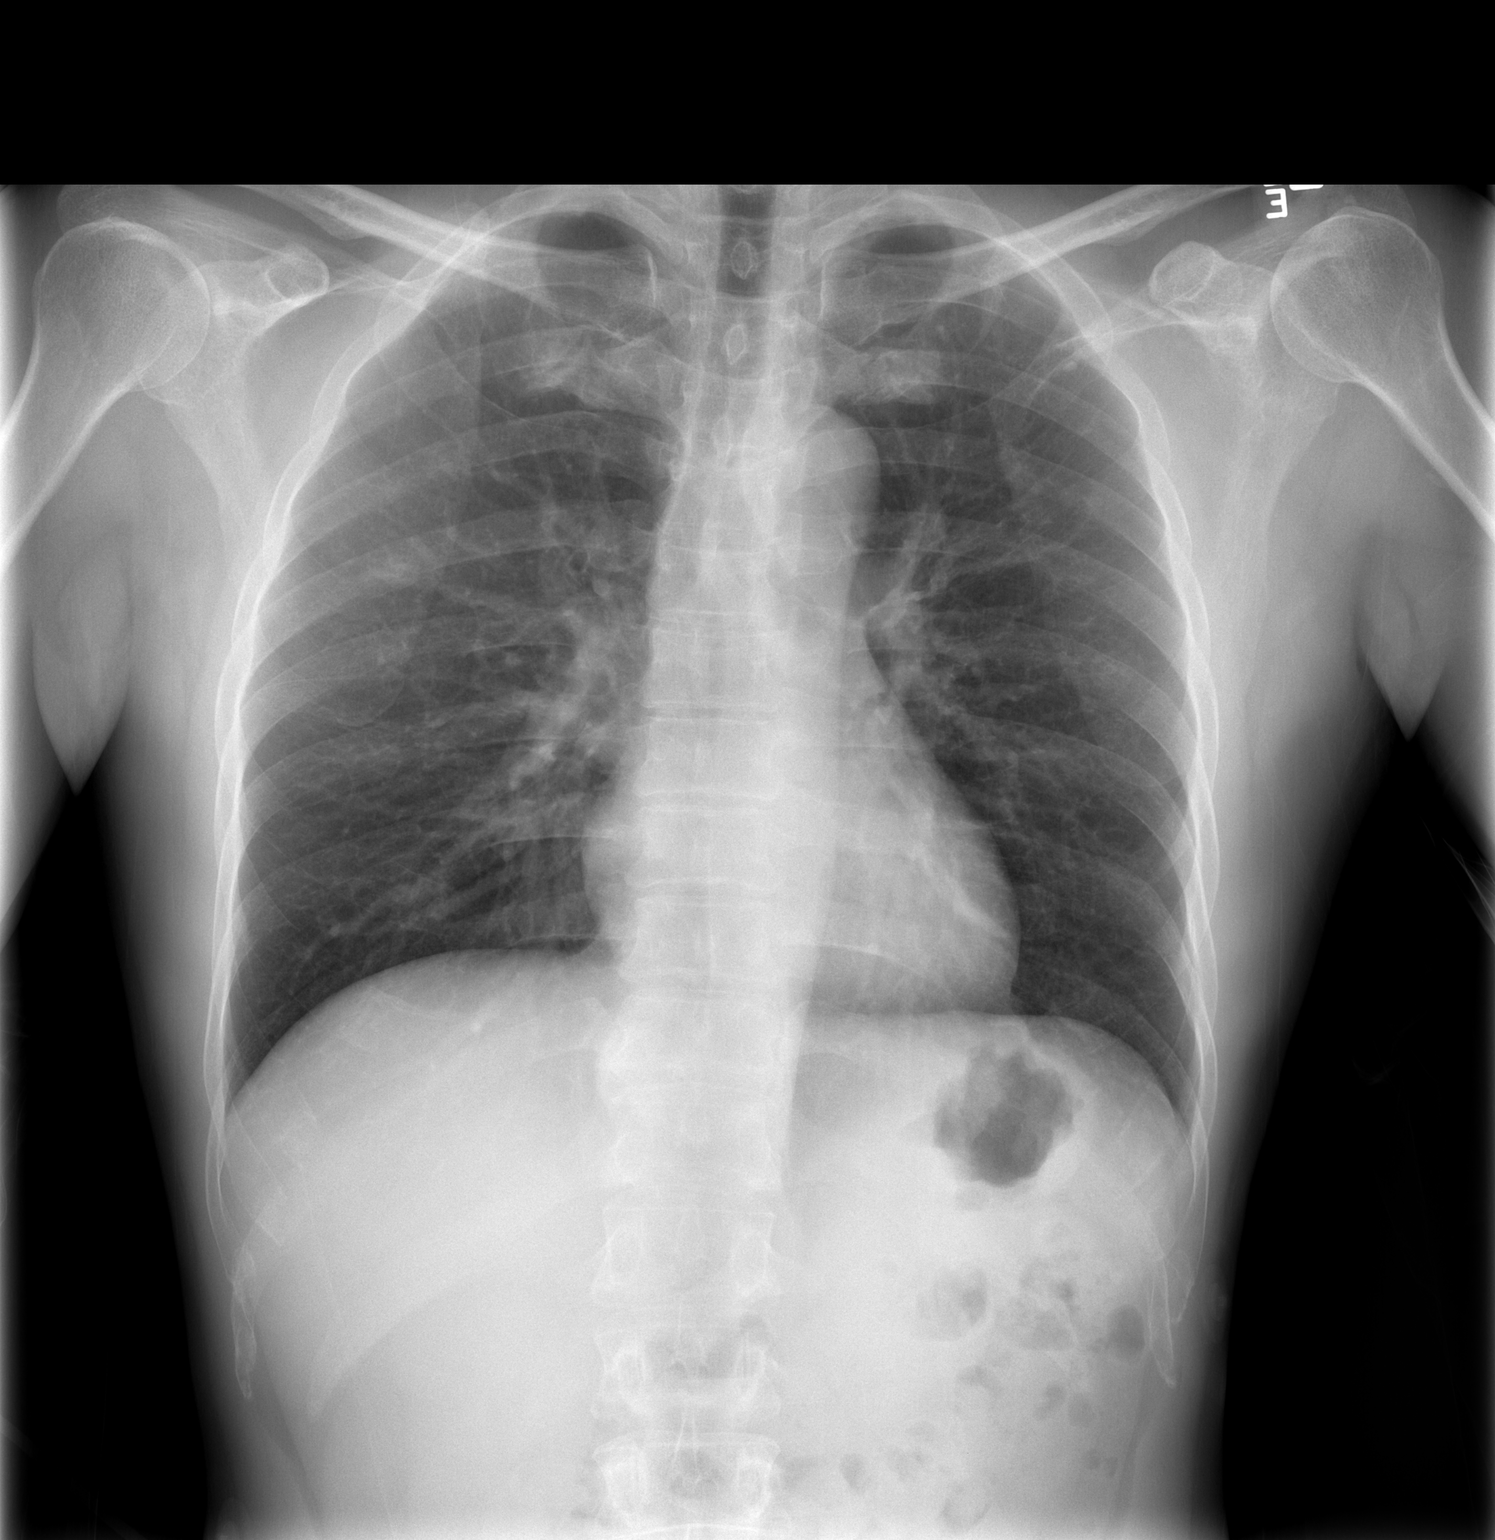

[w chest lat]
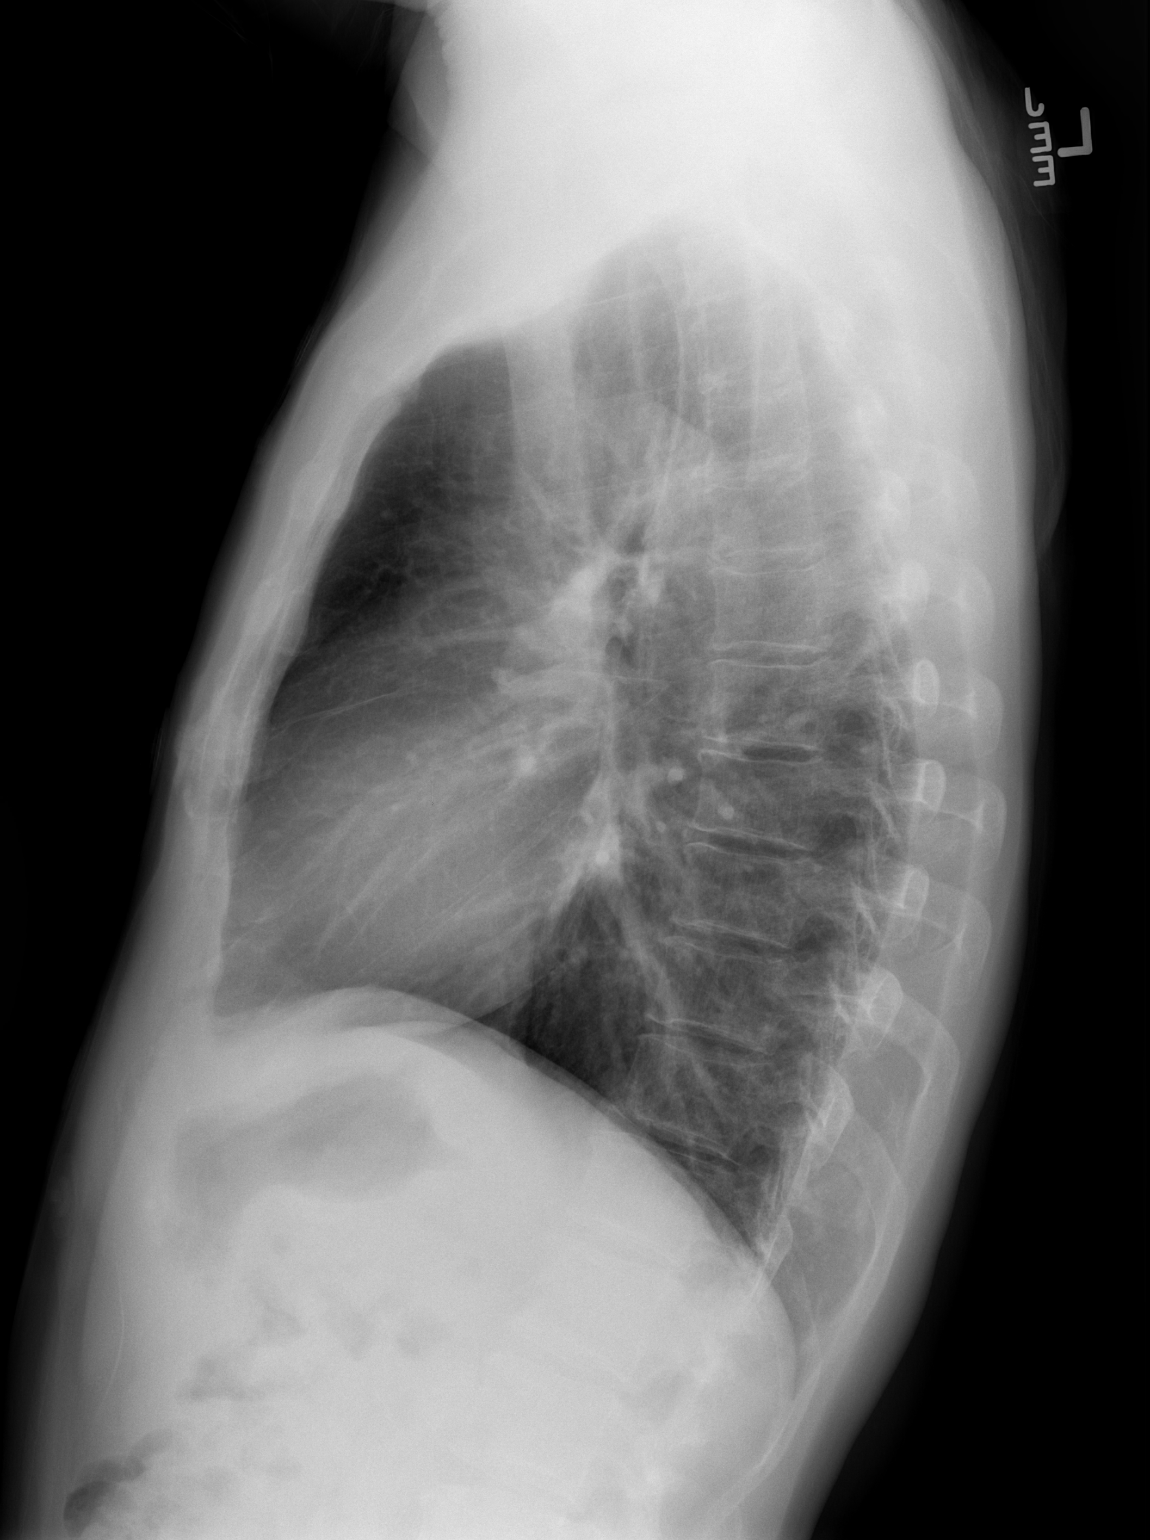

[2 of 2 positions shown; findings below may reference images not displayed]

FINDINGS: The lungs are mildly hyperinflated. There is no focal infiltrate.
There is minimal prominence of the perihilar interstitial markings.
The cardiopericardial silhouette is normal in size. The mediastinum
is normal in width. The pulmonary vascularity is not engorged. There
is no pleural effusion. The trachea is midline. The observed
portions of the bony thorax exhibit no acute abnormalities.
IMPRESSION: There is mild hyperinflation which may reflect COPD or reactive
airway disease. One cannot exclude acute bronchitis in the
appropriate clinical setting. There is no evidence of pneumonia nor
CHF.

## 2016-02-04 ENCOUNTER — Ambulatory Visit (HOSPITAL_COMMUNITY)
Admission: EM | Admit: 2016-02-04 | Discharge: 2016-02-04 | Disposition: A | Discharge status: Home / Self Care | Payer: PRIVATE HEALTH INSURANCE | Attending: Emergency Medicine | Admitting: Emergency Medicine

## 2016-02-04 ENCOUNTER — Encounter (HOSPITAL_COMMUNITY): Payer: Self-pay

## 2016-02-04 DIAGNOSIS — R12 Heartburn: Secondary | ICD-10-CM

## 2016-02-04 DIAGNOSIS — S161XXA Strain of muscle, fascia and tendon at neck level, initial encounter: Secondary | ICD-10-CM | POA: Diagnosis not present

## 2016-02-04 MED ORDER — MELOXICAM 15 MG PO TABS: 15.0000 mg | ORAL_TABLET | Freq: Every day | ORAL | Status: DC

## 2016-02-04 MED ORDER — OMEPRAZOLE 20 MG PO CPDR: 20.0000 mg | DELAYED_RELEASE_CAPSULE | Freq: Every day | ORAL | Status: DC

## 2016-02-04 MED ORDER — ONDANSETRON HCL 4 MG PO TABS: 4.0000 mg | ORAL_TABLET | Freq: Three times a day (TID) | ORAL | Status: DC | PRN

## 2016-02-04 NOTE — ED Notes (Signed)
Patient states he is having pain in neck leading up to back of head since this morning and he is not bale to keep any food down. No acute distress

## 2016-02-04 NOTE — Discharge Instructions (Signed)
The muscles in your neck got irritated with the vomiting. Take meloxicam daily to help with pain. Warm compresses or heating pad will also help. Use the Zofran every 8 hours as needed for nausea or vomiting. Take omeprazole daily for heartburn. Follow-up as needed.

## 2016-02-04 NOTE — ED Provider Notes (Signed)
CSN: [id number]     Arrival date & time 02/04/16  1430 History   First MD Initiated Contact with Patient 02/04/16 1502     Chief Complaint  Patient presents with  . Pain    Neck  . Emesis   (Consider location/radiation/quality/duration/timing/severity/associated sxs/prior Treatment) HPI He is a 50 year old man here for evaluation of vomiting and neck pain. He states he has had pain in his neck for quite some time. He is followed by a chiropractor for this. This morning, he had acute worsening of pain in the back of his neck to the base of his skull. He denies any injury or trauma. This appears to have triggered an episode of vomiting. He reports intermittent continued nausea, but no further emesis. He has tolerated water without difficulty. He does report some heartburn.He denies any radiating pain. He states his arms feel tired.  Pain is worse with looking to the left. Pain has eased up since this morning, but is still present.  His son did some cupping on his back today.  An interpreter was present for this encounter.  History reviewed. No pertinent past medical history. History reviewed. No pertinent past surgical history. Family History  Problem Relation Age of Onset  . Heart failure Father    Social History  Substance Use Topics  . Smoking status: Never Smoker   . Smokeless tobacco: None  . Alcohol Use: Yes     Comment: occasional    Review of Systems as in history of present illness Allergies  Review of patient's allergies indicates no known allergies.  Home Medications   Prior to Admission medications   Medication Sig Start Date End Date Taking? Authorizing Provider  meloxicam (MOBIC) 15 MG tablet Take 1 tablet (15 mg total) by mouth daily. 02/04/16   Charm RingsErin J Artemisa Sladek, MD  omeprazole (PRILOSEC) 20 MG capsule Take 1 capsule (20 mg total) by mouth daily. 02/04/16   Charm RingsErin J Rawn Quiroa, MD  ondansetron (ZOFRAN) 4 MG tablet Take 1 tablet (4 mg total) by mouth every 8 (eight) hours as  needed for nausea or vomiting. 02/04/16   Charm RingsErin J Deniro Laymon, MD   Meds Ordered and Administered this Visit  Medications - No data to display  BP 127/86 mmHg  Pulse 74  Temp(Src) 98.8 F (37.1 C) (Oral)  Resp 14  SpO2 100% No data found.   Physical Exam  Constitutional: He is oriented to person, place, and time. He appears well-developed and well-nourished.  Neck: Neck supple.  Normal flexion and extension. He does have pain with looking to the left. No specific point tenderness or appreciable muscle spasm. No vertebral step-offs.  Cardiovascular: Normal rate, regular rhythm and normal heart sounds.   No murmur heard. Pulmonary/Chest: Effort normal and breath sounds normal. No respiratory distress. He has no wheezes. He has no rales.  Lymphadenopathy:    He has no cervical adenopathy.  Neurological: He is alert and oriented to person, place, and time.    ED Course  Procedures (including critical care time)  Labs Review Labs Reviewed - No data to display  Imaging Review No results found.   MDM   1. Cervical muscle strain, initial encounter   2. Heartburn     Appears to be coming from muscle strain. This may have been exacerbated by the vomiting. Meloxicam to help with neck pain. Also recommended heat. Prescription given for Zofran to use as needed for nausea or vomiting. Follow-up as needed.    Charm RingsErin J Ardelle Haliburton, MD 02/04/16  1619 

## 2016-03-24 ENCOUNTER — Ambulatory Visit (INDEPENDENT_AMBULATORY_CARE_PROVIDER_SITE_OTHER): Payer: No Typology Code available for payment source | Admitting: Emergency Medicine

## 2016-03-24 VITALS — BP 110/68 | HR 90 | Temp 98.2°F | Resp 16 | Ht 64.0 in | Wt 101.0 lb

## 2016-03-24 DIAGNOSIS — Z8719 Personal history of other diseases of the digestive system: Secondary | ICD-10-CM

## 2016-03-24 DIAGNOSIS — Z1329 Encounter for screening for other suspected endocrine disorder: Secondary | ICD-10-CM

## 2016-03-24 DIAGNOSIS — Z13220 Encounter for screening for lipoid disorders: Secondary | ICD-10-CM

## 2016-03-24 DIAGNOSIS — S139XXD Sprain of joints and ligaments of unspecified parts of neck, subsequent encounter: Secondary | ICD-10-CM

## 2016-03-24 DIAGNOSIS — Z0000 Encounter for general adult medical examination without abnormal findings: Secondary | ICD-10-CM | POA: Diagnosis not present

## 2016-03-24 LAB — POCT CBC
GRANULOCYTE PERCENT: 59.3 % (ref 37–80)
HCT, POC: 46.6 % (ref 43.5–53.7)
HEMOGLOBIN: 16.7 g/dL (ref 14.1–18.1)
Lymph, poc: 1.6 (ref 0.6–3.4)
MCH, POC: 33 pg — AB (ref 27–31.2)
MCHC: 35.7 g/dL — AB (ref 31.8–35.4)
MCV: 92.3 fL (ref 80–97)
MID (cbc): 0.4 (ref 0–0.9)
MPV: 7.8 fL (ref 0–99.8)
POC Granulocyte: 2.9 (ref 2–6.9)
POC LYMPH %: 33.3 % (ref 10–50)
POC MID %: 7.4 %M (ref 0–12)
Platelet Count, POC: 212 10*3/uL (ref 142–424)
RBC: 5.05 M/uL (ref 4.69–6.13)
RDW, POC: 12.7 %
WBC: 4.9 10*3/uL (ref 4.6–10.2)

## 2016-03-24 LAB — POCT URINALYSIS DIP (MANUAL ENTRY)
Bilirubin, UA: NEGATIVE
GLUCOSE UA: NEGATIVE
Ketones, POC UA: NEGATIVE
Leukocytes, UA: NEGATIVE
Nitrite, UA: NEGATIVE
PH UA: 5.5
PROTEIN UA: NEGATIVE
RBC UA: NEGATIVE
SPEC GRAV UA: 1.025
UROBILINOGEN UA: 0.2

## 2016-03-24 LAB — TSH: TSH: 0.42 mIU/L (ref 0.40–4.50)

## 2016-03-24 NOTE — Progress Notes (Signed)
By signing my name below, I, Mesha Guinyard, attest that this documentation has been prepared under the direction and in the presence of Lesle ChrisSteven Daub, MD.  Electronically Signed: Arvilla MarketMesha Guinyard, Medical Scribe. 03/24/2016. 11:44 AM.  Chief Complaint:  Chief Complaint  Patient presents with  . Annual Exam    HPI: Patrick Gutierrez is a 50 y.o. male [facility]. Pt speaks little English so a translator was used for this office visit. Pt has been in the [place] for [place] Pt came in for his insurance. Pt has neck pain but no longer takes medication for it. Pt denies experiencing any physical problems.  Pt does not smoke or drink.  Pt exercises a little.  Pt is a Location managermachine operator.  Pt denies having any FHx.  No past medical history on file. No past surgical history on file. Social History   Social History  . Marital Status: Married    Spouse Name: N/A  . Number of Children: N/A  . Years of Education: N/A   Social History Main Topics  . Smoking status: Never Smoker   . Smokeless tobacco: None  . Alcohol Use: Yes     Comment: occasional  . Drug Use: No  . Sexual Activity: Not Asked   Other Topics Concern  . None   Social History Narrative   Family History  Problem Relation Age of Onset  . Heart failure Father    No Known Allergies Prior to Admission medications   Medication Sig Start Date End Date Taking? Authorizing Provider  meloxicam (MOBIC) 15 MG tablet Take 1 tablet (15 mg total) by mouth daily. 02/04/16  Yes Charm RingsErin J Honig, MD  omeprazole (PRILOSEC) 20 MG capsule Take 1 capsule (20 mg total) by mouth daily. 02/04/16  Yes Charm RingsErin J Honig, MD  ondansetron (ZOFRAN) 4 MG tablet Take 1 tablet (4 mg total) by mouth every 8 (eight) hours as needed for nausea or vomiting. 02/04/16  Yes Charm RingsErin J Honig, MD     ROS: The patient denies fevers, chills, night sweats, unintentional weight loss, chest pain, palpitations, wheezing, dyspnea  on exertion, nausea, vomiting, abdominal pain, dysuria, hematuria, melena, numbness, weakness, or tingling.  All other systems have been reviewed and were otherwise negative with the exception of those mentioned in the HPI and as above.    PHYSICAL EXAM: Filed Vitals:   03/24/16 1038  BP: 110/68  Pulse: 90  Temp: 98.2 F (36.8 C)  Resp: 16   Body mass index is 17.33 kg/(m^2).   General: Alert, no acute distress. Very thin gentleman HEENT:  Normocephalic, atraumatic, oropharynx patent. Pt has a patch on his neck. Eye: Nonie HoyerOMI, [facility] Cardiovascular:  Regular rate and rhythm, no rubs murmurs or gallops.  No Carotid bruits, radial pulse intact. No pedal edema.  Respiratory: Clear to auscultation bilaterally.  No wheezes, rales, or rhonchi.  No cyanosis, no use of accessory musculature Abdominal: No organomegaly, abdomen is soft and non-tender, positive bowel sounds.  No masses. Musculoskeletal: Gait intact. No edema, tenderness Skin: No rashes. Neurologic: Facial musculature symmetric. Psychiatric: Patient acts appropriately throughout our interaction. Lymphatic: No cervical or submandibular lymphadenopathy  LABS: Results for orders placed or performed in visit on 03/24/16  POCT CBC  Result Value Ref Range   WBC 4.9 4.6 - 10.2 K/uL   Lymph, poc 1.6 0.6 - 3.4   POC LYMPH PERCENT 33.3 10 - 50 %L   MID (cbc) 0.4 0 - 0.9  POC MID % 7.4 0 - 12 %M   POC Granulocyte 2.9 2 - 6.9   Granulocyte percent 59.3 37 - 80 %G   RBC 5.05 4.69 - 6.13 M/uL   Hemoglobin 16.7 14.1 - 18.1 g/dL   HCT, POC 16.1 09.6 - 53.7 %   MCV 92.3 80 - 97 fL   MCH, POC 33.0 (A) 27 - 31.2 pg   MCHC 35.7 (A) 31.8 - 35.4 g/dL   RDW, POC 04.5 %   Platelet Count, POC 212 142 - 424 K/uL   MPV 7.8 0 - 99.8 fL  POCT urinalysis dipstick  Result Value Ref Range   Color, UA yellow yellow   Clarity, UA clear clear   Glucose, UA negative negative   Bilirubin, UA negative negative   Ketones, POC UA negative negative     Spec Grav, UA 1.025    Blood, UA negative negative   pH, UA 5.5    Protein Ur, POC negative negative   Urobilinogen, UA 0.2    Nitrite, UA Negative Negative   Leukocytes, UA Negative Negative     EKG/XRAY:   Primary read interpreted by Dr. Cleta Alberts at [facility].   ASSESSMENT/PLAN: This is a healthy young male. Routine labs were done. I did go ahead and do hepatitis screening on him because of his country of origin. No recommendations at the present time.I personally performed the services described in this documentation, which was scribed in my presence. The recorded information has been reviewed and is accurate.   Gross sideeffects, risk and benefits, and alternatives of medications d/w patient. Patient is aware that all medications have potential sideeffects and we are unable to predict every sideeffect or drug-drug interaction that may occur.  Lesle Chris MD 03/24/2016 11:43 AM

## 2016-03-24 NOTE — Patient Instructions (Signed)
     IF you received an x-ray today, you will receive an invoice from [facility]. Please contact  Radiology at [phone] with questions or concerns regarding your invoice.   IF you received labwork today, you will receive an invoice from [facility]. Please contact [facility] at [phone] with questions or concerns regarding your invoice.   Our billing staff will not be able to assist you with questions regarding bills from these companies.  You will be contacted with the lab results as soon as they are available. The fastest way to get your results is to activate your My Chart account. Instructions are located on the last page of this paperwork. If you have not heard from us regarding the results in 2 weeks, please contact this office.      

## 2016-03-25 LAB — COMPLETE METABOLIC PANEL WITH GFR
ALT: 23 U/L (ref 9–46)
AST: 34 U/L (ref 10–40)
Albumin: 4.6 g/dL (ref 3.6–5.1)
Alkaline Phosphatase: 65 U/L (ref 40–115)
BILIRUBIN TOTAL: 0.9 mg/dL (ref 0.2–1.2)
BUN: 15 mg/dL (ref 7–25)
CO2: 24 mmol/L (ref 20–31)
Calcium: 9.2 mg/dL (ref 8.6–10.3)
Chloride: 101 mmol/L (ref 98–110)
Creat: 0.67 mg/dL (ref 0.60–1.35)
Glucose, Bld: 91 mg/dL (ref 65–99)
Potassium: 4 mmol/L (ref 3.5–5.3)
Sodium: 139 mmol/L (ref 135–146)
TOTAL PROTEIN: 8 g/dL (ref 6.1–8.1)

## 2016-03-25 LAB — LIPID PANEL
CHOL/HDL RATIO: 3.5 ratio (ref <=5.0)
Cholesterol: 211 mg/dL — ABNORMAL HIGH (ref 125–200)
HDL: 61 mg/dL (ref >=40)
LDL CALC: 100 mg/dL (ref <130)
Triglycerides: 249 mg/dL — ABNORMAL HIGH (ref <150)
VLDL: 50 mg/dL — AB (ref <30)

## 2016-03-25 LAB — HEPATITIS C ANTIBODY: HCV AB: NEGATIVE

## 2016-03-25 LAB — HIV ANTIBODY (ROUTINE TESTING W REFLEX): HIV: NONREACTIVE

## 2016-03-25 LAB — HEPATITIS B SURFACE ANTIGEN: Hepatitis B Surface Ag: NEGATIVE

## 2017-03-19 ENCOUNTER — Encounter: Payer: Self-pay | Admitting: Family Medicine

## 2017-03-19 ENCOUNTER — Ambulatory Visit (INDEPENDENT_AMBULATORY_CARE_PROVIDER_SITE_OTHER): Payer: No Typology Code available for payment source | Admitting: Family Medicine

## 2017-03-19 VITALS — BP 117/74 | HR 67 | Temp 97.8°F | Resp 17 | Ht 63.5 in | Wt 103.0 lb

## 2017-03-19 DIAGNOSIS — Z789 Other specified health status: Secondary | ICD-10-CM | POA: Diagnosis not present

## 2017-03-19 DIAGNOSIS — M542 Cervicalgia: Secondary | ICD-10-CM

## 2017-03-19 DIAGNOSIS — E785 Hyperlipidemia, unspecified: Secondary | ICD-10-CM

## 2017-03-19 DIAGNOSIS — Z0000 Encounter for general adult medical examination without abnormal findings: Secondary | ICD-10-CM

## 2017-03-19 NOTE — Progress Notes (Signed)
   Subjective:    Patient ID: Patrick Gutierrez, male    DOB: 07/29/1966, 51 y.o.   MRN: [id number]  HPI    Review of Systems  Constitutional: Negative.   HENT: Negative.   Eyes: Negative.   Respiratory: Negative.   Cardiovascular: Negative.   Gastrointestinal: Negative.   Endocrine: Negative.   Genitourinary: Negative.   Musculoskeletal: Negative.   Skin: Negative.   Allergic/Immunologic: Negative.   Neurological: Negative.   Hematological: Negative.   Psychiatric/Behavioral: Negative.        Objective:   Physical Exam        Assessment & Plan:

## 2017-03-19 NOTE — Patient Instructions (Addendum)
Repeat cholesterol tests today.  We will let you know when results are available.  Tylenol, Advil, or Aleve if needed for neck pain. If that does not help your symptoms, or any worsening symptoms, return to discuss further. I would recommend evaluation with orthopedic specialist if that pain continues.   Keeping you healthy  Get these tests  Blood pressure- Have your blood pressure checked once a year by your healthcare provider.  Normal blood pressure is 120/80.  Weight- Have your body mass index (BMI) calculated to screen for obesity.  BMI is a measure of body fat based on height and weight. You can also calculate your own BMI at https://www.west-esparza.com/www.nhlbisupport.com/bmi/.  Cholesterol- Have your cholesterol checked regularly starting at age 49, sooner may be necessary if you have diabetes, high blood pressure, if a family member developed heart diseases at an early age or if you smoke.   Chlamydia, HIV, and other sexual transmitted disease- Get screened each year until the age of 49 then within three months of each new sexual partner.  Diabetes- Have your blood sugar checked regularly if you have high blood pressure, high cholesterol, a family history of diabetes or if you are overweight.  Get these vaccines  Flu shot- Every fall.  Tetanus shot- Every 10 years.  Menactra- Single dose; prevents meningitis.  Take these steps  Don't smoke- If you do smoke, ask your healthcare provider about quitting. For tips on how to quit, go to www.smokefree.gov or call 1-800-QUIT-NOW.  Be physically active- Exercise 5 days a week for at least 30 minutes.  If you are not already physically active start slow and gradually work up to 30 minutes of moderate physical activity.  Examples of moderate activity include walking briskly, mowing the yard, dancing, swimming bicycling, etc.  Eat a healthy diet- Eat a variety of healthy foods such as fruits, vegetables, low fat milk, low fat cheese, yogurt, lean meats,  poultry, fish, beans, tofu, etc.  For more information on healthy eating, go to www.thenutritionsource.org  Drink alcohol in moderation- Limit alcohol intake two drinks or less a day.  Never drink and drive.  Dentist- Brush and floss teeth twice daily; visit your dentis twice a year.  Depression-Your emotional health is as important as your physical health.  If you're feeling down, losing interest in things you normally enjoy please talk with your healthcare provider.  Gun Safety- If you keep a gun in your home, keep it unloaded and with the safety lock on.  Bullets should be stored separately.  Helmet use- Always wear a helmet when riding a motorcycle, bicycle, rollerblading or skateboarding.  Safe sex- If you may be exposed to a sexually transmitted infection, use a condom  Seat belts- Seat bels can save your life; always wear one.  Smoke/Carbon Monoxide detectors- These detectors need to be installed on the appropriate level of your home.  Replace batteries at least once a year.  Skin Cancer- When out in the sun, cover up and use sunscreen SPF 15 or higher.  Violence- If anyone is threatening or hurting you, please tell your healthcare provider.   Neck Exercises Neck exercises can be important for many reasons:  They can help you to improve and maintain flexibility in your neck. This can be especially important as you age.  They can help to make your neck stronger. This can make movement easier.  They can reduce or prevent neck pain.  They may help your upper back.  Ask your health care provider  which neck exercises would be best for you. Exercises Neck Press Repeat this exercise 10 times. Do it first thing in the morning and right before bed or as told by your health care provider. 1. Lie on your back on a firm bed or on the floor with a pillow under your head. 2. Use your neck muscles to push your head down on the pillow and straighten your spine. 3. Hold the position as  well as you can. Keep your head facing up and your chin tucked. 4. Slowly count to 5 while holding this position. 5. Relax for a few seconds. Then repeat.  Isometric Strengthening Do a full set of these exercises 2 times a day or as told by your health care provider. 1. Sit in a supportive chair and place your hand on your forehead. 2. Push forward with your head and neck while pushing back with your hand. Hold for 10 seconds. 3. Relax. Then repeat the exercise 3 times. 4. Next, do thesequence again, this time putting your hand against the back of your head. Use your head and neck to push backward against the hand pressure. 5. Finally, do the same exercise on either side of your head, pushing sideways against the pressure of your hand.  Prone Head Lifts Repeat this exercise 5 times. Do this 2 times a day or as told by your health care provider. 1. Lie face-down, resting on your elbows so that your chest and upper back are raised. 2. Start with your head facing downward, near your chest. Position your chin either on or near your chest. 3. Slowly lift your head upward. Lift until you are looking straight ahead. Then continue lifting your head as far back as you can stretch. 4. Hold your head up for 5 seconds. Then slowly lower it to your starting position.  Supine Head Lifts Repeat this exercise 8-10 times. Do this 2 times a day or as told by your health care provider. 1. Lie on your back, bending your knees to point to the ceiling and keeping your feet flat on the floor. 2. Lift your head slowly off the floor, raising your chin toward your chest. 3. Hold for 5 seconds. 4. Relax and repeat.  Scapular Retraction Repeat this exercise 5 times. Do this 2 times a day or as told by your health care provider. 1. Stand with your arms at your sides. Look straight ahead. 2. Slowly pull both shoulders backward and downward until you feel a stretch between your shoulder blades in your upper  back. 3. Hold for 10-30 seconds. 4. Relax and repeat.  Contact a health care provider if:  Your neck pain or discomfort gets much worse when you do an exercise.  Your neck pain or discomfort does not improve within 2 hours after you exercise. If you have any of these problems, stop exercising right away. Do not do the exercises again unless your health care provider says that you can. Get help right away if:  You develop sudden, severe neck pain. If this happens, stop exercising right away. Do not do the exercises again unless your health care provider says that you can. Exercises Neck Stretch  Repeat this exercise 3-5 times. 1. Do this exercise while standing or while sitting in a chair. 2. Place your feet flat on the floor, shoulder-width apart. 3. Slowly turn your head to the right. Turn it all the way to the right so you can look over your right shoulder. Do not tilt  or tip your head. 4. Hold this position for 10-30 seconds. 5. Slowly turn your head to the left, to look over your left shoulder. 6. Hold this position for 10-30 seconds.  Neck Retraction Repeat this exercise 8-10 times. Do this 3-4 times a day or as told by your health care provider. 1. Do this exercise while standing or while sitting in a sturdy chair. 2. Look straight ahead. Do not bend your neck. 3. Use your fingers to push your chin backward. Do not bend your neck for this movement. Continue to face straight ahead. If you are doing the exercise properly, you will feel a slight sensation in your throat and a stretch at the back of your neck. 4. Hold the stretch for 1-2 seconds. Relax and repeat.  This information is not intended to replace advice given to you by your health care provider. Make sure you discuss any questions you have with your health care provider. Document Released: 09/05/2015 Document Revised: 03/01/2016 Document Reviewed: 04/04/2015 Elsevier Interactive Patient Education  2017 [employer].    IF you received an x-ray today, you will receive an invoice from [facility]. Please contact [facility] at [phone] with questions or concerns regarding your invoice.   IF you received labwork today, you will receive an invoice from [facility]. Please contact [facility] at [phone] with questions or concerns regarding your invoice.   Our billing staff will not be able to assist you with questions regarding bills from these companies.  You will be contacted with the lab results as soon as they are available. The fastest way to get your results is to activate your My Chart account. Instructions are located on the last page of this paperwork. If you have not heard from [place] regarding the results in 2 weeks, please contact this office.

## 2017-03-19 NOTE — Progress Notes (Signed)
Subjective:  By signing my name below, I, Patrick Gutierrez, attest that this documentation has been prepared under the direction and in the presence of Patrick FloodJeffrey R Trey Gulbranson, MD Electronically Signed: Charline BillsEssence Gutierrez, ED Scribe 03/19/2017 at 12:05 PM.   Patient ID: Patrick Gutierrez, male    DOB: 03/07/1966, 51 y.o.   MRN: [id number]  Chief Complaint  Patient presents with  . Annual Exam   HPI Patrick Gutierrez is a 51 y.o. male who presents to [facility] Care at [facility] for an annual exam. Pt is fasting at this visit. Last CPE was June 2017 with Dr. Cleta Gutierrez. Pt speaks [place]. ([employer] Interpreter ID #: [id number]). [facility].   Neck Pain Pt reports unchanged neck pain for the past year. 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 No treatments tried PTA. He denies weakness or numbness in hands. 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  CA screening  Prostate CA: No family hx.  Immunizations   There is no immunization history on file for this patient.  Depression Screening  [facility] 03/19/2017 03/24/2016 02/19/2015  Decreased Interest 0 0 0  Down, Depressed, Hopeless 0 0 0  PHQ - 2 Score 0 0 0     Visual Acuity Screening   Right eye Left eye Both eyes  Without correction: 20/20 20/25 20/15   With correction:      Hepatitis screening based on previous country of origin which was negative in June 2017. Non-reactive HIV. Normal TSH and CMP. Overall normal CBC at that visit. Cholesterol was borderline elevated and LDL 100.   Lab Results  Component Value Date   CHOL 211 (H) 03/24/2016   HDL 61 03/24/2016   LDLCALC 100 03/24/2016   TRIG 249 (H) 03/24/2016   CHOLHDL 3.5 03/24/2016   Pt declines STI testing at this visit. Pt denies groin, genital pain.   Patient Active Problem List   Diagnosis Date Noted  . Chest pain-ER visit within normal  limits 02/03/2014   History reviewed. No pertinent past medical history. History reviewed. No pertinent surgical history. No Known Allergies Prior to Admission medications   Not on File   Social History   Social History  . Marital status: Married    Spouse name: N/A  . Number of children: N/A  . Years of education: N/A   Occupational History  . Not on file.   Social History Main Topics  . Smoking status: Never Smoker  . Smokeless tobacco: Never Used  . Alcohol use Yes     Comment: occasional  . Drug use: No  . Sexual activity: Not on file   Other Topics Concern  . Not on file   Social History Narrative  . No narrative on file   Review of Systems  Musculoskeletal: Positive for neck pain.  Neurological: Negative for weakness and numbness.  All other systems reviewed and are negative.     Objective:   Physical Exam  Constitutional: He is oriented to person, place, and time. He appears well-developed and well-nourished.  HENT:  Head: Normocephalic and atraumatic.  Right Ear: External ear normal.  Left Ear: External ear normal.  Mouth/Throat: Oropharynx is clear and moist.  Eyes: Conjunctivae and EOM are normal. Pupils are equal, round, and reactive to light.  Neck: Normal range of motion. Neck supple. No thyromegaly present.  Pt describes area of  discomfort at the upper mid cervical spine. Full flexion. Slight decreased extension to ~60 degrees. Equal lateral flexion. Intact rotation.  Cardiovascular: Normal rate, regular rhythm, normal heart sounds and intact distal pulses.   Pulmonary/Chest: Effort normal and breath sounds normal. No respiratory distress. He has no wheezes.  Abdominal: Soft. He exhibits no distension. There is no tenderness.  Musculoskeletal: Normal range of motion. He exhibits no edema or tenderness.  Lymphadenopathy:    He has no cervical adenopathy.  Neurological: He is alert and oriented to person, place, and time. He has normal reflexes.    Reflex Scores:      Tricep reflexes are 2+ on the right side and 2+ on the left side.      Bicep reflexes are 2+ on the right side and 2+ on the left side.      Brachioradialis reflexes are 2+ on the right side and 2+ on the left side. Skin: Skin is warm and dry.  Psychiatric: He has a normal mood and affect. His behavior is normal.  Vitals reviewed.  Vitals:   03/19/17 1042  BP: 117/74  Pulse: 67  Resp: 17  Temp: 97.8 F (36.6 C)  TempSrc: Oral  SpO2: 98%  Weight: 103 lb (46.7 kg)  Height: 5' 3.5" (1.613 m)      Assessment & Plan:   Patrick Gutierrez is a 51 y.o. male Annual physical exam  - -anticipatory guidance as below in AVS, screening labs above. Health maintenance items as above in HPI discussed/recommended as applicable.   Hyperlipidemia, unspecified hyperlipidemia type - Plan: Comprehensive metabolic panel, Lipid panel  - Check labs to decide if treatment indicated  Neck pain  -Possible overuse/degenerative changes. Option of x-ray was discussed if persistent symptoms. Gentle range of motion, stretches, over-the-counter Tylenol if needed for mild symptoms. Also consider orthopedic eval if persistent neck pain.  Language barrier  -Interpreter used by video. Understanding expressed.  No orders of the defined types were placed in this encounter.  Patient Instructions    Repeat cholesterol tests today.  We will let you know when results are available.  Tylenol, Advil, or Aleve if needed for neck pain. If that does not help your symptoms, or any worsening symptoms, return to discuss further. I would recommend evaluation with orthopedic specialist if that pain continues.   Keeping you healthy  Get these tests  Blood pressure- Have your blood pressure checked once a year by your healthcare provider.  Normal blood pressure is 120/80.  Weight- Have your body mass index (BMI) calculated to screen for obesity.  BMI is a measure of body fat based on height and weight. You  can also calculate your own BMI at https://www.west-esparza.com/.  Cholesterol- Have your cholesterol checked regularly starting at age 82, sooner may be necessary if you have diabetes, high blood pressure, if a family member developed heart diseases at an early age or if you smoke.   Chlamydia, HIV, and other sexual transmitted disease- Get screened each year until the age of 58 then within three months of each new sexual partner.  Diabetes- Have your blood sugar checked regularly if you have high blood pressure, high cholesterol, a family history of diabetes or if you are overweight.  Get these vaccines  Flu shot- Every fall.  Tetanus shot- Every 10 years.  Menactra- Single dose; prevents meningitis.  Take these steps  Don't smoke- If you do smoke, ask your healthcare provider about quitting. For tips on how to quit, go to www.smokefree.gov  or call 1-800-QUIT-NOW.  Be physically active- Exercise 5 days a week for at least 30 minutes.  If you are not already physically active start slow and gradually work up to 30 minutes of moderate physical activity.  Examples of moderate activity include walking briskly, mowing the yard, dancing, swimming bicycling, etc.  Eat a healthy diet- Eat a variety of healthy foods such as fruits, vegetables, low fat milk, low fat cheese, yogurt, lean meats, poultry, fish, beans, tofu, etc.  For more information on healthy eating, go to www.thenutritionsource.org  Drink alcohol in moderation- Limit alcohol intake two drinks or less a day.  Never drink and drive.  Dentist- Brush and floss teeth twice daily; visit your dentis twice a year.  Depression-Your emotional health is as important as your physical health.  If you're feeling down, losing interest in things you normally enjoy please talk with your healthcare provider.  Gun Safety- If you keep a gun in your home, keep it unloaded and with the safety lock on.  Bullets should be stored separately.  Helmet use-  Always wear a helmet when riding a motorcycle, bicycle, rollerblading or skateboarding.  Safe sex- If you may be exposed to a sexually transmitted infection, use a condom  Seat belts- Seat bels can save your life; always wear one.  Smoke/Carbon Monoxide detectors- These detectors need to be installed on the appropriate level of your home.  Replace batteries at least once a year.  Skin Cancer- When out in the sun, cover up and use sunscreen SPF 15 or higher.  Violence- If anyone is threatening or hurting you, please tell your healthcare provider.   Neck Exercises Neck exercises can be important for many reasons:  They can help you to improve and maintain flexibility in your neck. This can be especially important as you age.  They can help to make your neck stronger. This can make movement easier.  They can reduce or prevent neck pain.  They may help your upper back.  Ask your health care provider which neck exercises would be best for you. Exercises Neck Press Repeat this exercise 10 times. Do it first thing in the morning and right before bed or as told by your health care provider. 1. Lie on your back on a firm bed or on the floor with a pillow under your head. 2. Use your neck muscles to push your head down on the pillow and straighten your spine. 3. Hold the position as well as you can. Keep your head facing up and your chin tucked. 4. Slowly count to 5 while holding this position. 5. Relax for a few seconds. Then repeat.  Isometric Strengthening Do a full set of these exercises 2 times a day or as told by your health care provider. 1. Sit in a supportive chair and place your hand on your forehead. 2. Push forward with your head and neck while pushing back with your hand. Hold for 10 seconds. 3. Relax. Then repeat the exercise 3 times. 4. Next, do thesequence again, this time putting your hand against the back of your head. Use your head and neck to push backward against  the hand pressure. 5. Finally, do the same exercise on either side of your head, pushing sideways against the pressure of your hand.  Prone Head Lifts Repeat this exercise 5 times. Do this 2 times a day or as told by your health care provider. 1. Lie face-down, resting on your elbows so that your chest and upper  back are raised. 2. Start with your head facing downward, near your chest. Position your chin either on or near your chest. 3. Slowly lift your head upward. Lift until you are looking straight ahead. Then continue lifting your head as far back as you can stretch. 4. Hold your head up for 5 seconds. Then slowly lower it to your starting position.  Supine Head Lifts Repeat this exercise 8-10 times. Do this 2 times a day or as told by your health care provider. 1. Lie on your back, bending your knees to point to the ceiling and keeping your feet flat on the floor. 2. Lift your head slowly off the floor, raising your chin toward your chest. 3. Hold for 5 seconds. 4. Relax and repeat.  Scapular Retraction Repeat this exercise 5 times. Do this 2 times a day or as told by your health care provider. 1. Stand with your arms at your sides. Look straight ahead. 2. Slowly pull both shoulders backward and downward until you feel a stretch between your shoulder blades in your upper back. 3. Hold for 10-30 seconds. 4. Relax and repeat.  Contact a health care provider if:  Your neck pain or discomfort gets much worse when you do an exercise.  Your neck pain or discomfort does not improve within 2 hours after you exercise. If you have any of these problems, stop exercising right away. Do not do the exercises again unless your health care provider says that you can. Get help right away if:  You develop sudden, severe neck pain. If this happens, stop exercising right away. Do not do the exercises again unless your health care provider says that you can. Exercises Neck Stretch  Repeat this  exercise 3-5 times. 1. Do this exercise while standing or while sitting in a chair. 2. Place your feet flat on the floor, shoulder-width apart. 3. Slowly turn your head to the right. Turn it all the way to the right so you can look over your right shoulder. Do not tilt or tip your head. 4. Hold this position for 10-30 seconds. 5. Slowly turn your head to the left, to look over your left shoulder. 6. Hold this position for 10-30 seconds.  Neck Retraction Repeat this exercise 8-10 times. Do this 3-4 times a day or as told by your health care provider. 1. Do this exercise while standing or while sitting in a sturdy chair. 2. Look straight ahead. Do not bend your neck. 3. Use your fingers to push your chin backward. Do not bend your neck for this movement. Continue to face straight ahead. If you are doing the exercise properly, you will feel a slight sensation in your throat and a stretch at the back of your neck. 4. Hold the stretch for 1-2 seconds. Relax and repeat.  This information is not intended to replace advice given to you by your health care provider. Make sure you discuss any questions you have with your health care provider. Document Released: 09/05/2015 Document Revised: 03/01/2016 Document Reviewed: 04/04/2015 Elsevier Interactive Patient Education  2017 [employer].    IF you received an x-ray today, you will receive an invoice from [facility]. Please contact [facility] at [phone] with questions or concerns regarding your invoice.   IF you received labwork today, you will receive an invoice from [facility]. Please contact [facility] at [phone] with questions or concerns regarding your invoice.   Our billing staff will not be able to assist you with questions regarding Gutierrez  from these companies.  You will be contacted with the lab results as soon as they are available. The fastest way to get your results is to activate your My Chart account.  Instructions are located on the last page of this paperwork. If you have not heard from [place] regarding the results in 2 weeks, please contact this office.       I personally performed the services described in this documentation, which was scribed in my presence. The recorded information has been reviewed and considered for accuracy and completeness, addended by me as needed, and agree with information above.  Signed,   Meredith Staggers, MD Primary Care at [facility].  03/20/17 12:40 PM

## 2017-03-20 LAB — COMPREHENSIVE METABOLIC PANEL
A/G RATIO: 1.8 (ref 1.2–2.2)
ALBUMIN: 4.9 g/dL (ref 3.5–5.5)
ALK PHOS: 61 IU/L (ref 39–117)
ALT: 27 IU/L (ref 0–44)
AST: 38 IU/L (ref 0–40)
BUN/Creatinine Ratio: 22 — ABNORMAL HIGH (ref 9–20)
BUN: 11 mg/dL (ref 6–24)
Bilirubin Total: 1.2 mg/dL (ref 0.0–1.2)
CALCIUM: 9.5 mg/dL (ref 8.7–10.2)
CO2: 26 mmol/L (ref 20–29)
Chloride: 100 mmol/L (ref 96–106)
Creatinine, Ser: 0.51 mg/dL — ABNORMAL LOW (ref 0.76–1.27)
GFR calc Af Amer: 147 mL/min/{1.73_m2} (ref >59)
GFR, EST NON AFRICAN AMERICAN: 127 mL/min/{1.73_m2} (ref >59)
Globulin, Total: 2.8 g/dL (ref 1.5–4.5)
Glucose: 91 mg/dL (ref 65–99)
POTASSIUM: 4.4 mmol/L (ref 3.5–5.2)
SODIUM: 142 mmol/L (ref 134–144)
Total Protein: 7.7 g/dL (ref 6.0–8.5)

## 2017-03-20 LAB — LIPID PANEL
CHOL/HDL RATIO: 2.9 ratio (ref 0.0–5.0)
Cholesterol, Total: 187 mg/dL (ref 100–199)
HDL: 64 mg/dL (ref >39)
LDL Calculated: 98 mg/dL (ref 0–99)
TRIGLYCERIDES: 126 mg/dL (ref 0–149)
VLDL Cholesterol Cal: 25 mg/dL (ref 5–40)

## 2017-04-03 ENCOUNTER — Encounter: Payer: Self-pay | Admitting: Radiology

## 2018-03-07 ENCOUNTER — Telehealth: Payer: Self-pay | Admitting: Urgent Care

## 2018-03-07 NOTE — Telephone Encounter (Signed)
Left message for patient to call back  to reschedule his physical that was scheduled for 03/08/2018. His last physical was 03/19/2017 so it has not been a year therefore it needs to be rescheduled

## 2018-03-08 ENCOUNTER — Encounter: Payer: No Typology Code available for payment source | Admitting: Urgent Care

## 2018-03-22 ENCOUNTER — Ambulatory Visit (INDEPENDENT_AMBULATORY_CARE_PROVIDER_SITE_OTHER): Payer: No Typology Code available for payment source | Admitting: Urgent Care

## 2018-03-22 ENCOUNTER — Encounter: Payer: Self-pay | Admitting: Urgent Care

## 2018-03-22 VITALS — BP 114/82 | HR 81 | Temp 97.7°F | Resp 16 | Ht 63.5 in | Wt 107.4 lb

## 2018-03-22 DIAGNOSIS — Z1321 Encounter for screening for nutritional disorder: Secondary | ICD-10-CM

## 2018-03-22 DIAGNOSIS — Z130 Encounter for screening for diseases of the blood and blood-forming organs and certain disorders involving the immune mechanism: Secondary | ICD-10-CM | POA: Diagnosis not present

## 2018-03-22 DIAGNOSIS — Z0000 Encounter for general adult medical examination without abnormal findings: Secondary | ICD-10-CM | POA: Diagnosis not present

## 2018-03-22 DIAGNOSIS — Z1329 Encounter for screening for other suspected endocrine disorder: Secondary | ICD-10-CM

## 2018-03-22 DIAGNOSIS — Z23 Encounter for immunization: Secondary | ICD-10-CM | POA: Diagnosis not present

## 2018-03-22 DIAGNOSIS — Z13228 Encounter for screening for other metabolic disorders: Secondary | ICD-10-CM

## 2018-03-22 NOTE — Progress Notes (Signed)
MRN: [id number]  Subjective:   [name] is a 52 y.o. male presenting for annual physical exam.  Patient is married, works in Systems developerproduction. Has good relationships at home, has a good support network.  Drinks alcohol on the weekends, has about a sixpack.  Denies smoking cigarettes.  Medical care team includes: PCP: Patient, No Pcp Per Specialists: None. Health Maintenance: Immunizations updated as below.  Patrick Gutierrez has a current medication list which includes the following prescription(s): ranitidine. He has No Known Allergies. Patrick Gutierrez denies past medical and surgical history. His family history includes Heart failure in his father.  Immunizations: Updated tdap today.   Review of Systems  Constitutional: Negative for chills, diaphoresis, fever, malaise/fatigue and weight loss.  HENT: Negative for congestion, ear discharge, ear pain, hearing loss, nosebleeds, sore throat and tinnitus.   Eyes: Negative for blurred vision, double vision, photophobia, pain, discharge and redness.  Respiratory: Negative for cough, shortness of breath and wheezing.   Cardiovascular: Negative for chest pain, palpitations and leg swelling.  Gastrointestinal: Negative for abdominal pain, blood in stool, constipation, diarrhea, nausea and vomiting.  Genitourinary: Negative for dysuria, flank pain, frequency, hematuria and urgency.  Musculoskeletal: Negative for back pain, joint pain and myalgias.  Skin: Negative for itching and rash.  Neurological: Negative for dizziness, tingling, seizures, loss of consciousness, weakness and headaches.  Endo/Heme/Allergies: Negative for polydipsia.  Psychiatric/Behavioral: Negative for depression, hallucinations, memory loss, substance abuse and suicidal ideas. The patient is not nervous/anxious and does not have insomnia.     Objective:   Vitals: BP 114/82 (BP Location: Left Arm, Patient Position: Sitting, Cuff Size: Normal)   Pulse 81   Temp 97.7 F (36.5 C) (Oral)   Resp  16   Ht 5' 3.5" (1.613 m)   Wt 107 lb 6.4 oz (48.7 kg)   SpO2 99%   BMI 18.73 kg/m    Visual Acuity Screening   Right eye Left eye Both eyes  Without correction:     With correction: 20/40 20/40 20/30     Physical Exam  Constitutional: He is oriented to person, place, and time. He appears well-developed and well-nourished.  HENT:  TM's intact bilaterally, no effusions or erythema. Nasal turbinates pink and moist, nasal passages patent. No sinus tenderness. Oropharynx clear, mucous membranes moist, dentition in good repair.  Eyes: Pupils are equal, round, and reactive to light. Conjunctivae and EOM are normal. Right eye exhibits no discharge. Left eye exhibits no discharge. No scleral icterus.  Neck: Normal range of motion. Neck supple. No thyromegaly present.  Cardiovascular: Normal rate, regular rhythm and intact distal pulses. Exam reveals no gallop and no friction rub.  No murmur heard. Pulmonary/Chest: No stridor. No respiratory distress. He has no wheezes. He has no rales.  Abdominal: Soft. Bowel sounds are normal. He exhibits no distension and no mass. There is no tenderness.  Musculoskeletal: Normal range of motion. He exhibits no edema or tenderness.  Lymphadenopathy:    He has no cervical adenopathy.  Neurological: He is alert and oriented to person, place, and time. He has normal reflexes. He displays normal reflexes. Coordination normal.  Skin: Skin is warm and dry. No rash noted. No erythema. No pallor.  Psychiatric: He has a normal mood and affect.    Assessment and Plan :   Annual physical exam  Need for Tdap vaccination  Screening for endocrine, nutritional, metabolic and immunity disorder - Plan: Comprehensive metabolic panel, Lipid panel, CBC, TSH  Patient is medically healthy, very pleasant person.  Labs  pending. Discussed healthy lifestyle, diet, exercise, preventative care, vaccinations, and addressed patient's concerns.     Wallis Bamberg, PA-C Primary Care  at [facility] [phone] 03/22/2018  9:34 AM

## 2018-03-22 NOTE — Patient Instructions (Addendum)
Health Maintenance, Male A healthy lifestyle and preventive care is important for your health and wellness. Ask your health care provider about what schedule of regular examinations is right for you. What should I know about weight and diet? Eat a Healthy Diet  Eat plenty of vegetables, fruits, whole grains, low-fat dairy products, and lean protein.  Do not eat a lot of foods high in solid fats, added sugars, or salt.  Maintain a Healthy Weight Regular exercise can help you achieve or maintain a healthy weight. You should:  Do at least 150 minutes of exercise each week. The exercise should increase your heart rate and make you sweat (moderate-intensity exercise).  Do strength-training exercises at least twice a week.  Watch Your Levels of Cholesterol and Blood Lipids  Have your blood tested for lipids and cholesterol every 5 years starting at 50 years of age. If you are at high risk for heart disease, you should start having your blood tested when you are 50 years old. You may need to have your cholesterol levels checked more often if: ? Your lipid or cholesterol levels are high. ? You are older than 50 years of age. ? You are at high risk for heart disease.  What should I know about cancer screening? Many types of cancers can be detected early and may often be prevented. Lung Cancer  You should be screened every year for lung cancer if: ? You are a current smoker who has smoked for at least 30 years. ? You are a former smoker who has quit within the past 15 years.  Talk to your health care provider about your screening options, when you should start screening, and how often you should be screened.  Colorectal Cancer  Routine colorectal cancer screening usually begins at 50 years of age and should be repeated every 5-10 years until you are 50 years old. You may need to be screened more often if early forms of precancerous polyps or small growths are found. Your health care provider  may recommend screening at an earlier age if you have risk factors for colon cancer.  Your health care provider may recommend using home test kits to check for hidden blood in the stool.  A small camera at the end of a tube can be used to examine your colon (sigmoidoscopy or colonoscopy). This checks for the earliest forms of colorectal cancer.  Prostate and Testicular Cancer  Depending on your age and overall health, your health care provider may do certain tests to screen for prostate and testicular cancer.  Talk to your health care provider about any symptoms or concerns you have about testicular or prostate cancer.  Skin Cancer  Check your skin from head to toe regularly.  Tell your health care provider about any new moles or changes in moles, especially if: ? There is a change in a mole's size, shape, or color. ? You have a mole that is larger than a pencil eraser.  Always use sunscreen. Apply sunscreen liberally and repeat throughout the day.  Protect yourself by wearing long sleeves, pants, a wide-brimmed hat, and sunglasses when outside.  What should I know about heart disease, diabetes, and high blood pressure?  If you are 18-39 years of age, have your blood pressure checked every 3-5 years. If you are 40 years of age or older, have your blood pressure checked every year. You should have your blood pressure measured twice-once when you are at a hospital or clinic, and once when   you are not at a hospital or clinic. Record the average of the two measurements. To check your blood pressure when you are not at a hospital or clinic, you can use: ? An automated blood pressure machine at a pharmacy. ? A home blood pressure monitor.  Talk to your health care provider about your target blood pressure.  If you are between 45-79 years old, ask your health care provider if you should take aspirin to prevent heart disease.  Have regular diabetes screenings by checking your fasting blood  sugar level. ? If you are at a normal weight and have a low risk for diabetes, have this test once every three years after the age of 45. ? If you are overweight and have a high risk for diabetes, consider being tested at a younger age or more often.  A one-time screening for abdominal aortic aneurysm (AAA) by ultrasound is recommended for men aged 65-75 years who are current or former smokers. What should I know about preventing infection? Hepatitis B If you have a higher risk for hepatitis B, you should be screened for this virus. Talk with your health care provider to find out if you are at risk for hepatitis B infection. Hepatitis C Blood testing is recommended for:  Everyone born from 1945 through 1965.  Anyone with known risk factors for hepatitis C.  Sexually Transmitted Diseases (STDs)  You should be screened each year for STDs including gonorrhea and chlamydia if: ? You are sexually active and are younger than 50 years of age. ? You are older than 50 years of age and your health care provider tells you that you are at risk for this type of infection. ? Your sexual activity has changed since you were last screened and you are at an increased risk for chlamydia or gonorrhea. Ask your health care provider if you are at risk.  Talk with your health care provider about whether you are at high risk of being infected with HIV. Your health care provider may recommend a prescription medicine to help prevent HIV infection.  What else can I do?  Schedule regular health, dental, and eye exams.  Stay current with your vaccines (immunizations).  Do not use any tobacco products, such as cigarettes, chewing tobacco, and e-cigarettes. If you need help quitting, ask your health care provider.  Limit alcohol intake to no more than 2 drinks per day. One drink equals 12 ounces of beer, 5 ounces of wine, or 1 ounces of hard liquor.  Do not use street drugs.  Do not share needles.  Ask your  health care provider for help if you need support or information about quitting drugs.  Tell your health care provider if you often feel depressed.  Tell your health care provider if you have ever been abused or do not feel safe at home. This information is not intended to replace advice given to you by your health care provider. Make sure you discuss any questions you have with your health care provider. Document Released: 03/22/2008 Document Revised: 05/23/2016 Document Reviewed: 06/28/2015 [employer] Interactive Patient Education  2018 [employer]     IF you received an x-ray today, you will receive an invoice from [facility]. Please contact [facility] at [phone] with questions or concerns regarding your invoice.   IF you received labwork today, you will receive an invoice from [facility]. Please contact [facility] at [phone] with questions or concerns regarding your invoice.   Our billing staff will not be   able to assist you with questions regarding bills from these companies.  You will be contacted with the lab results as soon as they are available. The fastest way to get your results is to activate your My Chart account. Instructions are located on the last page of this paperwork. If you have not heard from us regarding the results in 2 weeks, please contact this office.       

## 2018-03-23 ENCOUNTER — Encounter (HOSPITAL_COMMUNITY): Payer: Self-pay | Admitting: Urgent Care

## 2018-03-23 LAB — LIPID PANEL
CHOLESTEROL TOTAL: 210 mg/dL — AB (ref 100–199)
Chol/HDL Ratio: 3.5 ratio (ref 0.0–5.0)
HDL: 60 mg/dL (ref >39)
LDL CALC: 112 mg/dL — AB (ref 0–99)
TRIGLYCERIDES: 189 mg/dL — AB (ref 0–149)
VLDL Cholesterol Cal: 38 mg/dL (ref 5–40)

## 2018-03-23 LAB — CBC
HEMOGLOBIN: 16.4 g/dL (ref 13.0–17.7)
Hematocrit: 47.7 % (ref 37.5–51.0)
MCH: 33.3 pg — AB (ref 26.6–33.0)
MCHC: 34.4 g/dL (ref 31.5–35.7)
MCV: 97 fL (ref 79–97)
Platelets: 218 10*3/uL (ref 150–450)
RBC: 4.92 x10E6/uL (ref 4.14–5.80)
RDW: 13 % (ref 12.3–15.4)
WBC: 5.3 10*3/uL (ref 3.4–10.8)

## 2018-03-23 LAB — COMPREHENSIVE METABOLIC PANEL
ALK PHOS: 55 IU/L (ref 39–117)
ALT: 27 IU/L (ref 0–44)
AST: 37 IU/L (ref 0–40)
Albumin/Globulin Ratio: 1.6 (ref 1.2–2.2)
Albumin: 4.5 g/dL (ref 3.5–5.5)
BUN/Creatinine Ratio: 15 (ref 9–20)
BUN: 11 mg/dL (ref 6–24)
Bilirubin Total: 0.4 mg/dL (ref 0.0–1.2)
CHLORIDE: 104 mmol/L (ref 96–106)
CO2: 23 mmol/L (ref 20–29)
CREATININE: 0.71 mg/dL — AB (ref 0.76–1.27)
Calcium: 9 mg/dL (ref 8.7–10.2)
GFR calc Af Amer: 127 mL/min/{1.73_m2} (ref >59)
GFR calc non Af Amer: 110 mL/min/{1.73_m2} (ref >59)
GLUCOSE: 112 mg/dL — AB (ref 65–99)
Globulin, Total: 2.8 g/dL (ref 1.5–4.5)
Potassium: 4.1 mmol/L (ref 3.5–5.2)
SODIUM: 137 mmol/L (ref 134–144)
Total Protein: 7.3 g/dL (ref 6.0–8.5)

## 2018-03-23 LAB — TSH: TSH: 1.37 u[IU]/mL (ref 0.450–4.500)

## 2018-07-31 NOTE — Telephone Encounter (Signed)
done
